# Patient Record
Sex: Female | Born: 1960
Health system: Southern US, Community
[De-identification: ages and names within clinical notes are randomized; demographics above are authoritative.]

## PROBLEM LIST (undated history)

## (undated) DIAGNOSIS — R011 Cardiac murmur, unspecified: Secondary | ICD-10-CM

## (undated) DIAGNOSIS — Z9889 Other specified postprocedural states: Secondary | ICD-10-CM

## (undated) DIAGNOSIS — D219 Benign neoplasm of connective and other soft tissue, unspecified: Secondary | ICD-10-CM

## (undated) DIAGNOSIS — I499 Cardiac arrhythmia, unspecified: Secondary | ICD-10-CM

## (undated) DIAGNOSIS — F988 Other specified behavioral and emotional disorders with onset usually occurring in childhood and adolescence: Secondary | ICD-10-CM

## (undated) DIAGNOSIS — R112 Nausea with vomiting, unspecified: Secondary | ICD-10-CM

## (undated) DIAGNOSIS — D649 Anemia, unspecified: Secondary | ICD-10-CM

## (undated) HISTORY — PX: ENDOMETRIAL ABLATION: SHX621

## (undated) HISTORY — PX: APPENDECTOMY: SHX54

## (undated) HISTORY — PX: DIAGNOSTIC LAPAROSCOPY: SUR761

## (undated) HISTORY — PX: COLON SURGERY: SHX602

---

## 2003-07-01 ENCOUNTER — Ambulatory Visit (HOSPITAL_COMMUNITY): Admission: RE | Admit: 2003-07-01 | Discharge: 2003-07-01 | Payer: Self-pay | Admitting: Obstetrics and Gynecology

## 2003-07-02 ENCOUNTER — Other Ambulatory Visit: Admission: RE | Admit: 2003-07-02 | Discharge: 2003-07-02 | Payer: Self-pay | Admitting: Obstetrics and Gynecology

## 2004-02-16 ENCOUNTER — Ambulatory Visit (HOSPITAL_COMMUNITY): Admission: RE | Admit: 2004-02-16 | Discharge: 2004-02-16 | Payer: Self-pay | Admitting: Obstetrics and Gynecology

## 2004-09-22 ENCOUNTER — Ambulatory Visit (HOSPITAL_COMMUNITY): Admission: RE | Admit: 2004-09-22 | Discharge: 2004-09-22 | Payer: Self-pay | Admitting: Obstetrics and Gynecology

## 2004-09-23 ENCOUNTER — Encounter (INDEPENDENT_AMBULATORY_CARE_PROVIDER_SITE_OTHER): Payer: Self-pay | Admitting: Specialist

## 2005-02-21 ENCOUNTER — Ambulatory Visit (HOSPITAL_COMMUNITY): Admission: RE | Admit: 2005-02-21 | Discharge: 2005-02-21 | Payer: Self-pay | Admitting: Obstetrics and Gynecology

## 2005-03-23 ENCOUNTER — Other Ambulatory Visit: Admission: RE | Admit: 2005-03-23 | Discharge: 2005-03-23 | Payer: Self-pay | Admitting: Obstetrics and Gynecology

## 2006-02-22 ENCOUNTER — Ambulatory Visit (HOSPITAL_COMMUNITY): Admission: RE | Admit: 2006-02-22 | Discharge: 2006-02-22 | Payer: Self-pay | Admitting: Obstetrics and Gynecology

## 2007-02-25 ENCOUNTER — Ambulatory Visit (HOSPITAL_COMMUNITY): Admission: RE | Admit: 2007-02-25 | Discharge: 2007-02-25 | Payer: Self-pay | Admitting: Obstetrics and Gynecology

## 2007-07-17 ENCOUNTER — Encounter: Admission: RE | Admit: 2007-07-17 | Discharge: 2007-07-17 | Payer: Self-pay | Admitting: Obstetrics and Gynecology

## 2007-07-22 ENCOUNTER — Encounter: Admission: RE | Admit: 2007-07-22 | Discharge: 2007-07-22 | Payer: Self-pay | Admitting: Interventional Radiology

## 2007-08-22 ENCOUNTER — Ambulatory Visit (HOSPITAL_COMMUNITY): Admission: RE | Admit: 2007-08-22 | Discharge: 2007-08-23 | Payer: Self-pay | Admitting: Interventional Radiology

## 2007-09-05 ENCOUNTER — Encounter: Admission: RE | Admit: 2007-09-05 | Discharge: 2007-09-05 | Payer: Self-pay | Admitting: Interventional Radiology

## 2008-03-16 ENCOUNTER — Encounter: Admission: RE | Admit: 2008-03-16 | Discharge: 2008-03-16 | Payer: Self-pay | Admitting: Obstetrics and Gynecology

## 2008-12-31 ENCOUNTER — Emergency Department (HOSPITAL_BASED_OUTPATIENT_CLINIC_OR_DEPARTMENT_OTHER): Admission: EM | Admit: 2008-12-31 | Discharge: 2008-12-31 | Payer: Self-pay | Admitting: Emergency Medicine

## 2009-03-24 ENCOUNTER — Ambulatory Visit (HOSPITAL_COMMUNITY): Admission: RE | Admit: 2009-03-24 | Discharge: 2009-03-24 | Payer: Self-pay | Admitting: Obstetrics and Gynecology

## 2009-08-04 ENCOUNTER — Ambulatory Visit: Admission: RE | Admit: 2009-08-04 | Discharge: 2009-08-04 | Payer: Self-pay | Admitting: Cardiology

## 2009-08-04 ENCOUNTER — Encounter (INDEPENDENT_AMBULATORY_CARE_PROVIDER_SITE_OTHER): Payer: Self-pay | Admitting: Cardiology

## 2009-08-09 ENCOUNTER — Ambulatory Visit (HOSPITAL_COMMUNITY): Admission: RE | Admit: 2009-08-09 | Discharge: 2009-08-09 | Payer: Self-pay | Admitting: Gastroenterology

## 2009-11-24 ENCOUNTER — Encounter: Admission: RE | Admit: 2009-11-24 | Discharge: 2010-01-03 | Payer: Self-pay | Admitting: Family Medicine

## 2010-04-05 ENCOUNTER — Ambulatory Visit (HOSPITAL_COMMUNITY)
Admission: RE | Admit: 2010-04-05 | Discharge: 2010-04-05 | Payer: Self-pay | Source: Home / Self Care | Attending: Obstetrics and Gynecology | Admitting: Obstetrics and Gynecology

## 2010-09-09 NOTE — Op Note (Signed)
NAMELYRIC, ROSSANO       ACCOUNT NO.:  [id number]   MEDICAL RECORD NO.:  [id number]          PATIENT TYPE:  AMB   LOCATION:  SDC                           FACILITY:  [facility]   PHYSICIAN:  Osborn Coho, M.D.   DATE OF BIRTH:  Mar 16, 1961   DATE OF PROCEDURE:  09/22/2004  DATE OF DISCHARGE:                                 OPERATIVE REPORT   PREOPERATIVE DIAGNOSES:  1.  Menorrhagia.  2.  Anemia.   POSTOPERATIVE DIAGNOSES:  1.  Menorrhagia.  2.  Anemia.   PROCEDURE:  Hysteroscopy, D&C and endometrial ablation via NovaSure.   SURGEON:  Osborn Coho, M.D.   ANESTHESIA:  MAC.   FLUIDS:  1000 mL.   URINE OUTPUT:  Straight cath prior to procedure with sufficient quantity.   ESTIMATED BLOOD LOSS:  Minimal.   HYSTEROSCOPIC FLUID:  Deficit of LR 60 mL.   PATHOLOGY:  Endometrial curettings.   COMPLICATIONS:  None.   FINDINGS:  The uterus sounded to 11.5 cm, cervical length 4.5 cm, cavity  length 7 cm set at 6.5 plus on the NovaSure. Cavity width 4.6 cm, power 164,  watts, time 1 minute and 2 seconds.   DESCRIPTION OF PROCEDURE:  The patient was taken to the operating room and  after the risks, benefits, and alternatives were reviewed with the patient,  the patient verbalized an understanding and consent signed and witnessed.  The patient was placed under MAC per anesthesia and prepped and draped in  the normal sterile fashion. A bivalve speculum was placed in the patient's  vagina and a total of 10 mL of 1% lidocaine was administered for a  paracervical block. The anterior lip of the cervix was grasped with a single  tooth tenaculum and the cervix dilated for passage of the diagnostic  hysteroscope. Cervical length and uterus sounded to measurements as noted  above. The diagnostic hysteroscope was introduced and no intracavitary  lesions noted. The diagnostic hysteroscope was removed and curettage  performed and specimen sent to pathology. NovaSure was then set with  appropriate settings  and introduced into the endometrial cavity. Ablation was then performed with  a time of 1 minute and 2 seconds. All instruments were removed. There was  hemostasis at the tenaculum site. The patient tolerated the procedure well  and is awaiting transfer to the recovery room. Sponge count was correct.       AR/MEDQ  D:  09/22/2004  T:  09/22/2004  Job:  [id number]

## 2011-01-17 LAB — CBC
HCT: 32.5 — ABNORMAL LOW
Hemoglobin: 10.4 — ABNORMAL LOW
MCHC: 32.1
MCV: 71.3 — ABNORMAL LOW
Platelets: 452 — ABNORMAL HIGH
RBC: 4.56
RDW: 24.4 — ABNORMAL HIGH
WBC: 3.4 — ABNORMAL LOW

## 2011-01-17 LAB — CREATININE, SERUM
Creatinine, Ser: 0.85
GFR calc Af Amer: >60
GFR calc non Af Amer: >60

## 2011-01-17 LAB — HCG, SERUM, QUALITATIVE: Preg, Serum: NEGATIVE

## 2011-02-02 ENCOUNTER — Other Ambulatory Visit (HOSPITAL_COMMUNITY): Payer: Self-pay | Admitting: Obstetrics and Gynecology

## 2011-02-02 DIAGNOSIS — Z1231 Encounter for screening mammogram for malignant neoplasm of breast: Secondary | ICD-10-CM

## 2011-04-07 ENCOUNTER — Ambulatory Visit (HOSPITAL_COMMUNITY)
Admission: RE | Admit: 2011-04-07 | Discharge: 2011-04-07 | Disposition: A | Discharge status: Home / Self Care | Payer: 59 | Source: Ambulatory Visit | Attending: Obstetrics and Gynecology | Admitting: Obstetrics and Gynecology

## 2011-04-07 DIAGNOSIS — Z1231 Encounter for screening mammogram for malignant neoplasm of breast: Secondary | ICD-10-CM | POA: Insufficient documentation

## 2011-11-15 ENCOUNTER — Other Ambulatory Visit: Payer: Self-pay | Admitting: Obstetrics and Gynecology

## 2011-11-15 MED ORDER — CIPROFLOXACIN HCL 250 MG PO TABS: 250.0000 mg | ORAL_TABLET | Freq: Two times a day (BID) | ORAL | Status: DC

## 2011-11-15 MED ORDER — CIPROFLOXACIN HCL 250 MG PO TABS: 250.0000 mg | ORAL_TABLET | Freq: Two times a day (BID) | ORAL | Status: AC

## 2011-11-28 ENCOUNTER — Other Ambulatory Visit: Payer: Self-pay | Admitting: Obstetrics and Gynecology

## 2011-11-28 MED ORDER — FLUCONAZOLE 150 MG PO TABS: 150.0000 mg | ORAL_TABLET | Freq: Once | ORAL | Status: AC

## 2011-11-28 NOTE — Progress Notes (Signed)
Pt requesting Rx for diflucan and is out of town.  Rec be seen if sxs persist.

## 2012-01-12 ENCOUNTER — Other Ambulatory Visit: Payer: Self-pay | Admitting: Obstetrics and Gynecology

## 2012-01-12 MED ORDER — FLUCONAZOLE 150 MG PO TABS: 150.0000 mg | ORAL_TABLET | Freq: Once | ORAL | Status: DC

## 2012-02-13 ENCOUNTER — Other Ambulatory Visit: Payer: Self-pay | Admitting: Obstetrics and Gynecology

## 2012-02-13 MED ORDER — TRANEXAMIC ACID 650 MG PO TABS: 1300.0000 mg | ORAL_TABLET | Freq: Three times a day (TID) | ORAL | Status: DC

## 2012-02-13 MED ORDER — FLUCONAZOLE 150 MG PO TABS: 150.0000 mg | ORAL_TABLET | Freq: Once | ORAL | Status: DC

## 2012-04-15 ENCOUNTER — Other Ambulatory Visit: Payer: Self-pay | Admitting: Obstetrics and Gynecology

## 2012-04-15 MED ORDER — LORATADINE 10 MG PO TABS: 10.0000 mg | ORAL_TABLET | Freq: Every day | ORAL | Status: DC

## 2012-07-16 ENCOUNTER — Encounter (HOSPITAL_BASED_OUTPATIENT_CLINIC_OR_DEPARTMENT_OTHER): Payer: Self-pay

## 2012-07-16 ENCOUNTER — Emergency Department (HOSPITAL_BASED_OUTPATIENT_CLINIC_OR_DEPARTMENT_OTHER): Payer: BC Managed Care – HMO

## 2012-07-16 ENCOUNTER — Emergency Department (HOSPITAL_BASED_OUTPATIENT_CLINIC_OR_DEPARTMENT_OTHER)
Admission: EM | Admit: 2012-07-16 | Discharge: 2012-07-16 | Disposition: A | Discharge status: Home / Self Care | Payer: BC Managed Care – HMO | Attending: Emergency Medicine | Admitting: Emergency Medicine

## 2012-07-16 DIAGNOSIS — M545 Low back pain, unspecified: Secondary | ICD-10-CM | POA: Insufficient documentation

## 2012-07-16 DIAGNOSIS — Z3202 Encounter for pregnancy test, result negative: Secondary | ICD-10-CM | POA: Insufficient documentation

## 2012-07-16 DIAGNOSIS — Z8742 Personal history of other diseases of the female genital tract: Secondary | ICD-10-CM | POA: Insufficient documentation

## 2012-07-16 DIAGNOSIS — N390 Urinary tract infection, site not specified: Secondary | ICD-10-CM

## 2012-07-16 DIAGNOSIS — M549 Dorsalgia, unspecified: Secondary | ICD-10-CM

## 2012-07-16 DIAGNOSIS — R109 Unspecified abdominal pain: Secondary | ICD-10-CM | POA: Insufficient documentation

## 2012-07-16 DIAGNOSIS — N949 Unspecified condition associated with female genital organs and menstrual cycle: Secondary | ICD-10-CM | POA: Insufficient documentation

## 2012-07-16 DIAGNOSIS — R3915 Urgency of urination: Secondary | ICD-10-CM | POA: Insufficient documentation

## 2012-07-16 DIAGNOSIS — R300 Dysuria: Secondary | ICD-10-CM | POA: Insufficient documentation

## 2012-07-16 HISTORY — DX: Benign neoplasm of connective and other soft tissue, unspecified: D21.9

## 2012-07-16 LAB — URINALYSIS, ROUTINE W REFLEX MICROSCOPIC
Glucose, UA: NEGATIVE mg/dL
Hgb urine dipstick: NEGATIVE
Ketones, ur: 15 mg/dL — AB
Nitrite: NEGATIVE
Protein, ur: NEGATIVE mg/dL
Specific Gravity, Urine: 1.03 (ref 1.005–1.030)
Urobilinogen, UA: 1 mg/dL (ref 0.0–1.0)
pH: 6.5 (ref 5.0–8.0)

## 2012-07-16 LAB — CBC WITH DIFFERENTIAL/PLATELET
Eosinophils Absolute: 0.5 10*3/uL (ref 0.0–0.7)
Lymphocytes Relative: 22 % (ref 12–46)
Lymphs Abs: 1.3 10*3/uL (ref 0.7–4.0)
Neutrophils Relative %: 61 % (ref 43–77)
Platelets: 282 10*3/uL (ref 150–400)
RBC: 4.26 MIL/uL (ref 3.87–5.11)
WBC: 6 10*3/uL (ref 4.0–10.5)

## 2012-07-16 LAB — BASIC METABOLIC PANEL
GFR calc non Af Amer: 85 mL/min — ABNORMAL LOW (ref >90)
Glucose, Bld: 73 mg/dL (ref 70–99)
Potassium: 3.8 mEq/L (ref 3.5–5.1)
Sodium: 139 mEq/L (ref 135–145)

## 2012-07-16 LAB — URINE MICROSCOPIC-ADD ON

## 2012-07-16 LAB — PREGNANCY, URINE: Preg Test, Ur: NEGATIVE

## 2012-07-16 MED ORDER — DICLOFENAC SODIUM 75 MG PO TBEC: 75.0000 mg | DELAYED_RELEASE_TABLET | Freq: Two times a day (BID) | ORAL | Status: DC

## 2012-07-16 MED ORDER — NITROFURANTOIN MONOHYD MACRO 100 MG PO CAPS: 100.0000 mg | ORAL_CAPSULE | Freq: Two times a day (BID) | ORAL | Status: DC

## 2012-07-16 NOTE — ED Notes (Signed)
C/o lower back pain, pelvic pain, difficulty urinating-s/s started 5 days ago-+nausea-+constiaption-used enema then episode of diarrhea with results

## 2012-07-16 NOTE — ED Provider Notes (Signed)
History     CSN: [id number]  Arrival date & time 07/16/12  1137   First MD Initiated Contact with Patient 07/16/12 1228      Chief Complaint  Patient presents with  . Back Pain    (Consider location/radiation/quality/duration/timing/severity/associated sxs/prior treatment) HPI Comments: Patient is a 52 y/o F with PMHx of fibroids c/o lower back pain x 5 days. Patient reported that pain is localized to lower back described as a tightness with association to abdominal pain described as a fullness to the suprapubic region. Patient reported that back pain radiates to legs b/l  and suprapubic region. Pain is described as being severe. Patient reported using Ibuprofen, but found little relief. Patient reported dysuria and increase in urgency x 3 days - has gotten progressive worse, extreme burning sensation upon urination with presence of blood. Denied fever, vomiting, vaginal discharge, vaginal pruritis, shortness of breathe, chest pain, difficulty breathing.  Patient reported being sexually active, reported using protection.   Patient is a 52 y.o. female presenting with back pain. The history is provided by the patient. No language interpreter was used.  Back Pain Location:  Lumbar spine Quality: tightness. Pain severity:  Severe Pain is:  Same all the time Onset quality:  Sudden Duration:  5 days Timing:  Constant Progression:  Unchanged Chronicity:  New Context: not recent injury   Relieved by:  Nothing Worsened by:  Movement Ineffective treatments:  NSAIDs (used Ibuprofen) Associated symptoms: dysuria and pelvic pain   Associated symptoms: no abdominal pain, no abdominal swelling, no chest pain, no fever, no headaches and no leg pain   Dysuria:    Severity:  Moderate   Onset quality:  Sudden   Duration:  3 days   Timing:  Intermittent   Progression:  Worsening   Chronicity:  New Risk factors: not pregnant     Past Medical History  Diagnosis Date  . Fibroids     Past  Surgical History  Procedure Laterality Date  . Cesarean section    . Uterine artery embolization      No family history on file.  History  Substance Use Topics  . Smoking status: Never Smoker   . Smokeless tobacco: Not on file  . Alcohol Use: Yes    OB History   Grav Para Term Preterm Abortions TAB SAB Ect Mult Living                  Review of Systems  Constitutional: Negative for fever and chills.  HENT: Negative for neck pain.   Respiratory: Negative for chest tightness and shortness of breath.   Cardiovascular: Negative for chest pain.  Gastrointestinal: Negative for abdominal pain.  Genitourinary: Positive for dysuria, urgency and pelvic pain.  Musculoskeletal: Positive for back pain.  Neurological: Negative for headaches.  10 Systems reviewed and are negative for acute change except as noted in the HPI.   Allergies  Review of patient's allergies indicates no known allergies.  Home Medications   Current Outpatient Rx  Name  Route  Sig  Dispense  Refill  . diclofenac (VOLTAREN) 75 MG EC tablet   Oral   Take 1 tablet (75 mg total) by mouth 2 (two) times daily.   14 tablet   0   . fluconazole (DIFLUCAN) 150 MG tablet   Oral   Take 1 tablet (150 mg total) by mouth once.   1 tablet   2   . loratadine (CLARITIN) 10 MG tablet   Oral   Take  1 tablet (10 mg total) by mouth daily.   30 tablet   1   . nitrofurantoin, macrocrystal-monohydrate, (MACROBID) 100 MG capsule   Oral   Take 1 capsule (100 mg total) by mouth 2 (two) times daily. X 7 days   14 capsule   0   . tranexamic acid (LYSTEDA) 650 MG TABS   Oral   Take 2 tablets (1,300 mg total) by mouth 3 (three) times daily.   30 tablet   0     BP 144/65  Pulse 83  Temp(Src) 98.3 F (36.8 C) (Oral)  Resp 16  Ht 5\' 8"  (1.727 m)  Wt 150 lb (68.04 kg)  BMI 22.81 kg/m2  SpO2 100%  LMP 07/13/2012  Physical Exam  Nursing note and vitals reviewed. Constitutional: She is oriented to person, place,  and time. She appears well-developed and well-nourished. No distress.  Eyes: Conjunctivae and EOM are normal. Pupils are equal, round, and reactive to light. Right eye exhibits no discharge. Left eye exhibits no discharge.  Neck: Normal range of motion. Neck supple. No tracheal deviation present.  Cardiovascular: Normal rate, regular rhythm, normal heart sounds and intact distal pulses.  Exam reveals no friction rub.   No murmur heard. Pulmonary/Chest: Effort normal and breath sounds normal. She has no wheezes. She has no rales.  Abdominal: Soft. Bowel sounds are normal. She exhibits no distension. There is tenderness. There is no rebound.  Mild tenderness to suprapubic region.   Negative CVA tenderness b/l.  Lymphadenopathy:    She has no cervical adenopathy.  Neurological: She is alert and oriented to person, place, and time.  Skin: Skin is warm and dry. No rash noted. She is not diaphoretic. No erythema.  Psychiatric: She has a normal mood and affect. Her behavior is normal.    ED Course  Procedures (including critical care time)  Labs Reviewed  URINALYSIS, ROUTINE W REFLEX MICROSCOPIC - Abnormal; Notable for the following:    Bilirubin Urine SMALL (*)    Ketones, ur 15 (*)    Leukocytes, UA SMALL (*)    All other components within normal limits  URINE MICROSCOPIC-ADD ON - Abnormal; Notable for the following:    Squamous Epithelial / LPF FEW (*)    Bacteria, UA FEW (*)    All other components within normal limits  CBC WITH DIFFERENTIAL - Abnormal; Notable for the following:    Hemoglobin 11.6 (*)    HCT 35.6 (*)    RDW 16.3 (*)    Eosinophils Relative 8 (*)    All other components within normal limits  BASIC METABOLIC PANEL - Abnormal; Notable for the following:    GFR calc non Af Amer 85 (*)    All other components within normal limits  URINE CULTURE  PREGNANCY, URINE   Ct Abdomen Pelvis Wo Contrast  07/16/2012  *RADIOLOGY REPORT*  Clinical Data: Back pain, hematuria.   CT ABDOMEN AND PELVIS WITHOUT CONTRAST  Technique:  Multidetector CT imaging of the abdomen and pelvis was performed following the standard protocol without intravenous contrast.  Comparison: None.  Findings: Visualized lung bases appear normal.  These unenhanced images demonstrate no focal abnormality involving the liver, spleen or pancreas.  No gallstones are noted.  Adrenal glands appear normal.  No hydronephrosis or renal obstruction is noted.  2 cm cyst is seen in mid pole of right kidney.  No renal or ureteral calculi are noted.  The appendix appears normal.  4.6 cm low density is noted in right  adnexa most consistent with renal cyst. Enlarged uterus is noted with multiple calcified degenerating fibroids.  Urinary bladder appears normal.  Stool is noted in the rectum.  IMPRESSION: No hydronephrosis or renal obstruction is identified.  4.6 cm probable right ovarian cyst is noted.  Fibroid uterus is noted.   Original Report Authenticated By: Lupita Raider.,  M.D.      1. UTI (urinary tract infection)   2. Abdominal pain   3. Back pain       MDM  Patient is a 52 y/o F with PMHx of uterine fibroids c/o lower back pain associated with suprapubic pressure x 5 days with the occurrence of dysuria and hematuria x 3 days. Patient denied fever, chills, vaginal discharge, calf pain, shortness of breathe, chest pain, difficulty breathing.  I personally evaluated and examined the patient. PE: Alert and oriented. Abdomen: Non-distended, soft, pain upon palpation to suprapubic region, no guarding or rebound tenderness. No CVA tenderness. Lungs: CTA b/l. Heart: regular rate and rhythm, S1/S2 heard normal.  Ordered CBC, BMP, UA, UA microscopic, Urine culture, Pregnancy test, CT Abdomen and Pelivs without contrast  CBC: low Hgb and Hct - trend seen from previous labs that have been reviewed BMP: negative findings UA: small bilrubin, small leukocytes - negative nitrites, proteins, glucose Pregnancy urine:  negative Urine microscopic: few bacteria, mucus present CT Abdomen Pelvis wo contrast: no hydronephrosis or renal obstruction. 4.6 cm right ovarian cyst, possible renal cyst. Fibroid of uterus noted. Urine culture pending  Patient afebrile, normotensive, non-tachycardic, alert and oriented, happy affect. Patient aseptic, non-toxic appearing. Discharged patient with possible beginnings of UTI. Discharged patient with Macrobid and Voltaren. Discussed with patient to follow-up with Dr. Osborn Coho regarding condition and recent visit to the ED, discussed with patient to discuss with gynecologist regarding right ovarian cyst found on CT Abdomen Pelvis. Discussed with patient that she can continue drinking cranberry juice. Discussed with patient to return to the ED if symptoms are to worsen. Patient agreed with plan of care, understood, all questions answered.         Raymon Mutton, PA-C 07/16/12 2243

## 2012-07-16 NOTE — ED Notes (Signed)
Patient transported to CT 

## 2012-07-17 LAB — URINE CULTURE

## 2012-07-17 NOTE — ED Provider Notes (Signed)
Medical screening examination/treatment/procedure(s) were performed by non-physician practitioner and as supervising physician I was immediately available for consultation/collaboration.   Monike Bragdon III, MD 07/17/12 2115 

## 2012-07-17 NOTE — ED Provider Notes (Signed)
Urine cultures reviewed. Positive findings for UTI, >/= 100,000 colonies, growth of E. Coli. Patient was discharged with Macrobid for infection.   Raymon Mutton, PA-C 07/17/12 1713

## 2012-07-18 ENCOUNTER — Telehealth (HOSPITAL_COMMUNITY): Payer: Self-pay | Admitting: Emergency Medicine

## 2012-07-18 NOTE — ED Provider Notes (Signed)
Medical screening examination/treatment/procedure(s) were performed by non-physician practitioner and as supervising physician I was immediately available for consultation/collaboration.   Carleene Cooper III, MD 07/18/12 [phone]

## 2012-07-18 NOTE — ED Notes (Signed)
Positive urnc- treated per protocol. No further follow up at this time.  

## 2012-08-16 ENCOUNTER — Other Ambulatory Visit: Payer: Self-pay | Admitting: Obstetrics and Gynecology

## 2012-08-16 DIAGNOSIS — Z1231 Encounter for screening mammogram for malignant neoplasm of breast: Secondary | ICD-10-CM

## 2012-08-29 ENCOUNTER — Ambulatory Visit (HOSPITAL_COMMUNITY): Payer: BC Managed Care – HMO

## 2012-08-29 ENCOUNTER — Ambulatory Visit (HOSPITAL_COMMUNITY)
Admission: RE | Admit: 2012-08-29 | Discharge: 2012-08-29 | Disposition: A | Discharge status: Home / Self Care | Payer: 59 | Source: Ambulatory Visit | Attending: Obstetrics and Gynecology | Admitting: Obstetrics and Gynecology

## 2012-08-29 DIAGNOSIS — Z1231 Encounter for screening mammogram for malignant neoplasm of breast: Secondary | ICD-10-CM | POA: Insufficient documentation

## 2012-10-21 ENCOUNTER — Telehealth (INDEPENDENT_AMBULATORY_CARE_PROVIDER_SITE_OTHER): Payer: Self-pay | Admitting: General Surgery

## 2012-10-21 NOTE — Telephone Encounter (Signed)
Nurse called from pt's GI office. Stated they have a water leak at this time and are unable to fax the Preliminary Results from the Colonoscopy. Pt has  NP appt with Myrtis Hopping 7/1. She advised it would be sent over before tomm.

## 2012-10-22 ENCOUNTER — Ambulatory Visit (INDEPENDENT_AMBULATORY_CARE_PROVIDER_SITE_OTHER): Payer: Commercial Managed Care - PPO | Admitting: General Surgery

## 2012-10-22 ENCOUNTER — Encounter (INDEPENDENT_AMBULATORY_CARE_PROVIDER_SITE_OTHER): Payer: Self-pay | Admitting: General Surgery

## 2012-10-22 VITALS — BP 124/70 | HR 80 | Resp 16 | Ht 68.0 in | Wt 159.2 lb

## 2012-10-22 DIAGNOSIS — K6389 Other specified diseases of intestine: Secondary | ICD-10-CM

## 2012-10-22 NOTE — Progress Notes (Signed)
Patient ID: Jocelyn Howell, female   DOB: 1960/07/27, 52 y.o.   MRN: [id number]  Chief Complaint  Patient presents with  . New Evaluation    eval near obs colon mass    HPI Jocelyn Howell is a 52 y.o. female.  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  She underwent first screening colonoscopy at age 45 with Dr Bosie Clos. She was found to have a tortuous colon and a partially obstructing mass in the distal ascending colon that was tattoed. She had another small polyp in the transverse colon which was completely excised. 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  This is not resectable endoscopically and she was referred. She has some intermittent right sided abdominal cramping and increased bs but otherwise has no change in either stool caliber or bowel habits.  No n/v/weight loss.  Past Medical History  Diagnosis Date  . Fibroids     Past Surgical History  Procedure Laterality Date  . Cesarean section    . Endometrial ablation    . Laparoscopy      History reviewed. No pertinent family history.  Social History History  Substance Use Topics  . Smoking status: Never Smoker   . Smokeless tobacco: Not on file  . Alcohol Use: Yes    No Known Allergies  Current Outpatient Prescriptions  Medication Sig Dispense Refill  . amphetamine-dextroamphetamine (ADDERALL XR) 10 MG 24 hr capsule Take 10 mg by mouth every morning.      . diclofenac (VOLTAREN) 75 MG EC tablet Take 1 tablet (75 mg total) by mouth 2 (two) times daily.  14 tablet  0  . fluconazole (DIFLUCAN) 150 MG tablet Take 1 tablet (150 mg total) by mouth once.  1 tablet  2  . loratadine (CLARITIN) 10 MG tablet Take 1 tablet (10 mg total) by mouth daily.  30 tablet  1  . nitrofurantoin, macrocrystal-monohydrate, (MACROBID) 100 MG capsule Take 1 capsule (100 mg total) by mouth 2 (two) times daily. X 7 days  14  capsule  0  . tranexamic acid (LYSTEDA) 650 MG TABS Take 2 tablets (1,300 mg total) by mouth 3 (three) times daily.  30 tablet  0   No current facility-administered medications for this visit.    Review of Systems Review of Systems  Constitutional: Negative for fever, chills and unexpected weight change.  HENT: Negative for hearing loss, congestion, sore throat, trouble swallowing and voice change.   Eyes: Negative for visual disturbance.  Respiratory: Negative for cough and wheezing.   Cardiovascular: Negative for chest pain, palpitations and leg swelling.  Gastrointestinal: Negative for nausea, vomiting, abdominal pain, diarrhea, constipation, blood in stool, abdominal distention and anal bleeding.  Genitourinary: Negative for hematuria, vaginal bleeding and difficulty urinating.  Musculoskeletal: Negative for arthralgias.  Skin: Negative for rash and wound.  Neurological: Negative for seizures, syncope and headaches.  Hematological: Negative for adenopathy. Does not bruise/bleed easily.  Psychiatric/Behavioral: Negative for confusion.    Blood pressure 124/70, pulse 80, resp. rate 16, height 5\' 8"  (1.727 m), weight 159 lb 3.2 oz (72.213 kg).  Physical Exam Physical Exam  Vitals reviewed. Constitutional: She is oriented to person, place, and time. She appears well-developed and well-nourished.  Eyes: No scleral icterus.  Neck: Neck supple.  Cardiovascular: Normal rate, regular rhythm and normal heart sounds.   Pulmonary/Chest: Effort normal and breath sounds normal.  She has no wheezes. She has no rales.  Abdominal: Soft. Bowel sounds are normal. She exhibits no mass. There is no tenderness.  Lymphadenopathy:    She has no cervical adenopathy.  Neurological: She is oriented to person, place, and time.    Data Reviewed Colonoscopy report from Dr Bosie Clos  Assessment    Right colon mass     Plan    Laparoscopic right colectomy     Cancer of the Colon, Surgical  Treatment  Based on the location and type of colon cancer that you have, we have determined that surgical removal of this part of your colon will be part of your treatment.  This will treat symptoms of bleeding or blockage that you may be experiencing.  I will do everything that I can to remove your entire tumor safely. To do this, some normal tissue must also be removed to give you the best chance for a cure. The following will help describe what happens when you have this surgery. We discussed although there is not cancer in current path that is possibility given size. Hopefully this will be benign though.  TREATMENT Surgery is the most common treatment for colorectal cancer.It treats the cancer in the colon and the area close to the tumor by removing the tumor and some of the healthy tissue around it. This tissue includes an apron of tissue with lymph nodes and blood vessels.  We usually use laparoscopy (smaller incisions) to help minimize the size of the larger incision on your abdomen.  However, even if we use laparoscopy primarily, we still need to make an incision large enough to bring the two ends of the colon out to reconnect them.  For tumors that are very large or very adherent to the adjacent structures, we may have to make a larger incision to do your operation safely. The surgeon checks the rest of the abdomen, the intestine and the liver to see if the cancer has spread.  There may be microscopic disease present that we are unable to detect.    The part of colon that we plan to remove in your case is the right side.  WHAT HAPPENS AFTER SURGERY: After surgery, you will go first to the recovery room, then to your room.   You will have  tubes and lines in place which is standard for this operation.  You will have several IVs..  YOU MIGHT NOT BE ABLE TO EAT FOR SEVERAL DAYS AFTER SURGERY.  You will have a catheter in your bladder for around 1 days.  You will have compression stockings on your  legs to decrease the risk of blood clots.    We will address your pain in several ways.  We will use an IV pain pump called a "PCA," or Patient Controlled Analgesia.  This allows you to press a button and immediately receive a dose of pain medication without waiting for a nurse.  We also use IV Tylenol and sometimes IV Toradol which is similar to ibuprofen.   I use doses and medications that work for the majority of people, but you may need an adjustment to the dose or type of medicine if your pain is not adequately controlled.  Your throat may be sore, in which case you may need a throat spray or lozenges.  Some patients become disoriented with the pain medication and may need adjustments due to that.    We will ask you to get out of bed the day after surgery  in order to maximize your chances of not having complications.  Your risk of pneumonia and blood clots is lower with walking and sitting in the chair.  We will also ask you to perform breathing exercises.  We will also ask to you walk in your room and in the halls for the above reasons, but also in order for you to keep up your strength.     POSSIBLE COMPLICATIONS This is a major operation and includes complications listed below: Bleeding Infection (5-20%) Possible wound complications such as hernia Damage to adjacent structures Leak of surgical connections, which can lead to other surgeries and possibly an ostomy. (1-5%) Possible need for other procedures, such as abscess drains in radiology  Possible prolonged hospital stay Possible diarrhea from removal of part of the colon. Possible constipation from narcotics.    Prolonged fatigue/weakness/appetite MOST PATIENTS' ENERGY LEVEL IS NOT BACK TO NORMAL FOR AT LEAST 2-4 MONTHS.  OLDER PATIENTS MAY FEEL WEAK FOR LONGER PERIODS OF TIME.   Difficulty with eating or post operative nausea  Possible early recurrence of cancer Possible complications of your medical problems such as heart disease  or arrhythmias. Death (less than 1%)  All possible complications are not listed, just the most common.    FURTHER INFORMATION? Please ask questions if you find something that we did not discuss in the office and would like more information.  If you would like another appointment if you have many questions or if your family members would like to come as well, please contact the office.        Jocelyn Howell 10/22/2012, 3:43 PM

## 2012-10-28 ENCOUNTER — Encounter (HOSPITAL_COMMUNITY): Payer: Self-pay | Admitting: Respiratory Therapy

## 2012-10-29 ENCOUNTER — Encounter (INDEPENDENT_AMBULATORY_CARE_PROVIDER_SITE_OTHER): Payer: Self-pay

## 2012-10-29 ENCOUNTER — Encounter (HOSPITAL_COMMUNITY): Payer: Self-pay

## 2012-10-29 ENCOUNTER — Encounter (HOSPITAL_COMMUNITY)
Admission: RE | Admit: 2012-10-29 | Discharge: 2012-10-29 | Disposition: A | Discharge status: Home / Self Care | Payer: 59 | Source: Ambulatory Visit | Attending: General Surgery | Admitting: General Surgery

## 2012-10-29 ENCOUNTER — Telehealth (INDEPENDENT_AMBULATORY_CARE_PROVIDER_SITE_OTHER): Payer: Self-pay | Admitting: General Surgery

## 2012-10-29 HISTORY — DX: Other specified behavioral and emotional disorders with onset usually occurring in childhood and adolescence: F98.8

## 2012-10-29 HISTORY — DX: Cardiac arrhythmia, unspecified: I49.9

## 2012-10-29 HISTORY — DX: Other specified postprocedural states: Z98.890

## 2012-10-29 HISTORY — DX: Anemia, unspecified: D64.9

## 2012-10-29 HISTORY — DX: Cardiac murmur, unspecified: R01.1

## 2012-10-29 HISTORY — DX: Other specified postprocedural states: R11.2

## 2012-10-29 LAB — CBC WITH DIFFERENTIAL/PLATELET
Basophils Relative: 0 % (ref 0–1)
Eosinophils Relative: 8 % — ABNORMAL HIGH (ref 0–5)
Hemoglobin: 12 g/dL (ref 12.0–15.0)
Lymphocytes Relative: 49 % — ABNORMAL HIGH (ref 12–46)
MCH: 27.1 pg (ref 26.0–34.0)
Monocytes Absolute: 0.4 10*3/uL (ref 0.1–1.0)
Monocytes Relative: 5 % (ref 3–12)
Neutrophils Relative %: 38 % — ABNORMAL LOW (ref 43–77)
RBC: 4.42 MIL/uL (ref 3.87–5.11)

## 2012-10-29 LAB — COMPREHENSIVE METABOLIC PANEL
ALT: 9 U/L (ref 0–35)
Alkaline Phosphatase: 54 U/L (ref 39–117)
CO2: 28 mEq/L (ref 19–32)
Chloride: 105 mEq/L (ref 96–112)
GFR calc Af Amer: >90 mL/min (ref >90)
GFR calc non Af Amer: 83 mL/min — ABNORMAL LOW (ref >90)
Glucose, Bld: 86 mg/dL (ref 70–99)
Potassium: 4 mEq/L (ref 3.5–5.1)
Sodium: 140 mEq/L (ref 135–145)
Total Bilirubin: 0.5 mg/dL (ref 0.3–1.2)

## 2012-10-29 MED ORDER — CEFAZOLIN SODIUM-DEXTROSE 2-3 GM-% IV SOLR
2.0000 g | INTRAVENOUS | Status: AC
  Administered 2012-10-30: 2 g via INTRAVENOUS
  Filled 2012-10-29: qty 50

## 2012-10-29 MED ORDER — PEG 3350-KCL-NABCB-NACL-NASULF 236 G PO SOLR: 4.0000 L | Freq: Once | ORAL | Status: DC

## 2012-10-29 MED ORDER — METRONIDAZOLE IN NACL 5-0.79 MG/ML-% IV SOLN
500.0000 mg | INTRAVENOUS | Status: AC
  Administered 2012-10-30: 500 mg via INTRAVENOUS
  Filled 2012-10-29: qty 100

## 2012-10-29 NOTE — Pre-Procedure Instructions (Signed)
Jocelyn Howell  10/29/2012   Your procedure is scheduled on:  Wednesday, July 9th.  Report to [facility] at 8:10AM.  Call this number if you have problems the morning of surgery: [phone]   Remember:   Do not eat food or drink liquids after midnight.   Take these medicines the morning of surgery with A SIP OF WATER: None   Do not wear jewelry, make-up or nail polish.  Do not wear lotions, powders, or perfumes.  Do not shave 48 hours prior to surgery.   Do not bring valuables to the hospital.  [facility] is not responsible for any belongings or valuables.  Contacts, dentures or bridgework may not be worn into surgery.  Leave suitcase in the car. After surgery it may be brought to your room.  For patients admitted to the hospital, checkout time is 11:00 AM the day of discharge.   Patients discharged the day of surgery will not be allowed to drive home.  Name and phone number of your driver: -  Special Instructions: Shower with CHG wash (Bactoshield) tonight and again in the am prior to arriving to hospital.   Please read over the following fact sheets that you were given: Pain Booklet, Coughing and Deep Breathing, Blood Transfusion Information and Surgical Site Infection Prevention

## 2012-10-29 NOTE — Telephone Encounter (Signed)
Called pt again to give her instructions about the Miralax prep. The pt was planning on working today until 8pm and wanted to know if she could start drinking the prep at that time so I asked Dr Dwain Sarna about the start time for the prep. Dr Dwain Sarna advised ok for pt to start drinking Miralax at 8pm and she only has to drink half of the 64oz. Prep continue to stay on the clear liquids. The pt requested I phone the Miralax rx to the out pt. Pharmacy at [facility] so I will call the rx there for her to p/u. The pt understands.

## 2012-10-29 NOTE — Telephone Encounter (Signed)
Preadmissions called to see if patient needs a bowel prep for procedure tomorrow. I paged Dr Dwain Sarna who does want her to have the golytely prep. Golytely RX sent to patient's pharmacy. LMOM for patient to call back and ask for me. To make her aware that RX called in and let her know she needs to pick up instructions for prep. Awaiting call back.

## 2012-10-30 ENCOUNTER — Inpatient Hospital Stay (HOSPITAL_COMMUNITY): Payer: 59 | Admitting: Anesthesiology

## 2012-10-30 ENCOUNTER — Encounter (HOSPITAL_COMMUNITY): Payer: Self-pay | Admitting: Anesthesiology

## 2012-10-30 ENCOUNTER — Encounter (HOSPITAL_COMMUNITY): Payer: Self-pay | Admitting: *Deleted

## 2012-10-30 ENCOUNTER — Encounter (HOSPITAL_COMMUNITY)
Admission: RE | Disposition: A | Discharge status: Home / Self Care | Payer: Self-pay | Source: Ambulatory Visit | Attending: General Surgery

## 2012-10-30 ENCOUNTER — Inpatient Hospital Stay (HOSPITAL_COMMUNITY)
Admission: RE | Admit: 2012-10-30 | Discharge: 2012-11-04 | DRG: 330 | Disposition: A | Discharge status: Home / Self Care | Payer: 59 | Source: Ambulatory Visit | Attending: General Surgery | Admitting: General Surgery

## 2012-10-30 DIAGNOSIS — D126 Benign neoplasm of colon, unspecified: Principal | ICD-10-CM | POA: Diagnosis present

## 2012-10-30 DIAGNOSIS — K560 Paralytic ileus: Secondary | ICD-10-CM | POA: Diagnosis present

## 2012-10-30 DIAGNOSIS — K429 Umbilical hernia without obstruction or gangrene: Secondary | ICD-10-CM | POA: Diagnosis present

## 2012-10-30 HISTORY — PX: HERNIA REPAIR: SHX51

## 2012-10-30 HISTORY — PX: LAPAROSCOPIC PARTIAL COLECTOMY: SHX5907

## 2012-10-30 LAB — CBC
HCT: 34.9 % — ABNORMAL LOW (ref 36.0–46.0)
Hemoglobin: 11.4 g/dL — ABNORMAL LOW (ref 12.0–15.0)
MCHC: 32.7 g/dL (ref 30.0–36.0)
RBC: 4.29 MIL/uL (ref 3.87–5.11)

## 2012-10-30 LAB — CREATININE, SERUM
GFR calc Af Amer: >90 mL/min (ref >90)
GFR calc non Af Amer: >90 mL/min (ref >90)

## 2012-10-30 SURGERY — LAPAROSCOPIC PARTIAL COLECTOMY
Anesthesia: General | Site: Abdomen | Laterality: Right | Wound class: Contaminated

## 2012-10-30 MED ORDER — METOCLOPRAMIDE HCL 5 MG/ML IJ SOLN
INTRAMUSCULAR | Status: AC
  Filled 2012-10-30: qty 2

## 2012-10-30 MED ORDER — DIPHENHYDRAMINE HCL 12.5 MG/5ML PO ELIX: 12.5000 mg | ORAL_SOLUTION | Freq: Four times a day (QID) | ORAL | Status: DC | PRN

## 2012-10-30 MED ORDER — MIDAZOLAM HCL 5 MG/5ML IJ SOLN
INTRAMUSCULAR | Status: DC | PRN
  Administered 2012-10-30: 2 mg via INTRAVENOUS

## 2012-10-30 MED ORDER — CEFAZOLIN SODIUM-DEXTROSE 2-3 GM-% IV SOLR
2.0000 g | Freq: Three times a day (TID) | INTRAVENOUS | Status: AC
  Administered 2012-10-30: 2 g via INTRAVENOUS
  Filled 2012-10-30: qty 50

## 2012-10-30 MED ORDER — ONDANSETRON HCL 4 MG PO TABS: 4.0000 mg | ORAL_TABLET | Freq: Four times a day (QID) | ORAL | Status: DC | PRN

## 2012-10-30 MED ORDER — METOCLOPRAMIDE HCL 5 MG/ML IJ SOLN
INTRAMUSCULAR | Status: DC | PRN
  Administered 2012-10-30: 5 mg via INTRAVENOUS

## 2012-10-30 MED ORDER — LIDOCAINE HCL (CARDIAC) 20 MG/ML IV SOLN
INTRAVENOUS | Status: DC | PRN
  Administered 2012-10-30: 50 mg via INTRAVENOUS

## 2012-10-30 MED ORDER — METRONIDAZOLE IN NACL 5-0.79 MG/ML-% IV SOLN
500.0000 mg | Freq: Three times a day (TID) | INTRAVENOUS | Status: AC
  Administered 2012-10-30: 500 mg via INTRAVENOUS
  Filled 2012-10-30: qty 100

## 2012-10-30 MED ORDER — NALOXONE HCL 0.4 MG/ML IJ SOLN: 0.4000 mg | INTRAMUSCULAR | Status: DC | PRN

## 2012-10-30 MED ORDER — BUPIVACAINE HCL (PF) 0.25 % IJ SOLN
INTRAMUSCULAR | Status: DC | PRN
  Administered 2012-10-30: 30 mL

## 2012-10-30 MED ORDER — SODIUM CHLORIDE 0.9 % IV SOLN
INTRAVENOUS | Status: DC
  Administered 2012-10-30 – 2012-10-31 (×2): via INTRAVENOUS

## 2012-10-30 MED ORDER — SODIUM CHLORIDE 0.9 % IJ SOLN: 9.0000 mL | INTRAMUSCULAR | Status: DC | PRN

## 2012-10-30 MED ORDER — METOCLOPRAMIDE HCL 5 MG/ML IJ SOLN
10.0000 mg | Freq: Once | INTRAMUSCULAR | Status: DC | PRN
  Administered 2012-10-30: 10 mg via INTRAVENOUS

## 2012-10-30 MED ORDER — MORPHINE SULFATE (PF) 1 MG/ML IV SOLN
INTRAVENOUS | Status: AC
  Filled 2012-10-30: qty 25

## 2012-10-30 MED ORDER — PHENYLEPHRINE HCL 10 MG/ML IJ SOLN
INTRAMUSCULAR | Status: DC | PRN
  Administered 2012-10-30 (×3): 40 ug via INTRAVENOUS

## 2012-10-30 MED ORDER — ROCURONIUM BROMIDE 100 MG/10ML IV SOLN
INTRAVENOUS | Status: DC | PRN
Start: 2012-10-30 — End: 2012-10-30
  Administered 2012-10-30: 50 mg via INTRAVENOUS

## 2012-10-30 MED ORDER — HEPARIN SODIUM (PORCINE) 5000 UNIT/ML IJ SOLN: 5000.0000 [IU] | Freq: Once | INTRAMUSCULAR | Status: AC

## 2012-10-30 MED ORDER — OXYCODONE HCL 5 MG/5ML PO SOLN: 5.0000 mg | Freq: Once | ORAL | Status: DC | PRN

## 2012-10-30 MED ORDER — ACETAMINOPHEN 10 MG/ML IV SOLN
1000.0000 mg | Freq: Four times a day (QID) | INTRAVENOUS | Status: AC
  Administered 2012-10-30 – 2012-10-31 (×4): 1000 mg via INTRAVENOUS
  Filled 2012-10-30 (×4): qty 100

## 2012-10-30 MED ORDER — ALVIMOPAN 12 MG PO CAPS
12.0000 mg | ORAL_CAPSULE | Freq: Two times a day (BID) | ORAL | Status: DC
  Administered 2012-10-31 – 2012-11-03 (×7): 12 mg via ORAL
  Filled 2012-10-30 (×11): qty 1

## 2012-10-30 MED ORDER — ONDANSETRON HCL 4 MG/2ML IJ SOLN
4.0000 mg | Freq: Four times a day (QID) | INTRAMUSCULAR | Status: DC | PRN
  Administered 2012-10-30 – 2012-11-02 (×4): 4 mg via INTRAVENOUS
  Filled 2012-10-30 (×4): qty 2

## 2012-10-30 MED ORDER — NEOSTIGMINE METHYLSULFATE 1 MG/ML IJ SOLN
INTRAMUSCULAR | Status: DC | PRN
  Administered 2012-10-30: 4 mg via INTRAVENOUS

## 2012-10-30 MED ORDER — MORPHINE SULFATE (PF) 1 MG/ML IV SOLN
INTRAVENOUS | Status: DC
  Administered 2012-10-30 – 2012-10-31 (×2): 4 mg via INTRAVENOUS
  Administered 2012-10-31: 5 mg via INTRAVENOUS
  Administered 2012-10-31: 7 mg via INTRAVENOUS
  Administered 2012-10-31: 5 mg via INTRAVENOUS
  Administered 2012-10-31: 7 mg via INTRAVENOUS
  Administered 2012-10-31: 1 mg via INTRAVENOUS
  Administered 2012-10-31: 16:00:00 via INTRAVENOUS
  Administered 2012-10-31: 5 mg via INTRAVENOUS
  Filled 2012-10-30 (×2): qty 25

## 2012-10-30 MED ORDER — SODIUM CHLORIDE 0.9 % IR SOLN
Status: DC | PRN
  Administered 2012-10-30 (×2): 1000 mL

## 2012-10-30 MED ORDER — HYDROMORPHONE HCL PF 1 MG/ML IJ SOLN
INTRAMUSCULAR | Status: AC
  Administered 2012-10-30: 0.25 mg via INTRAVENOUS
  Filled 2012-10-30: qty 1

## 2012-10-30 MED ORDER — ACETAMINOPHEN 325 MG PO TABS: 650.0000 mg | ORAL_TABLET | Freq: Four times a day (QID) | ORAL | Status: DC | PRN

## 2012-10-30 MED ORDER — KETOROLAC TROMETHAMINE 15 MG/ML IJ SOLN
15.0000 mg | Freq: Four times a day (QID) | INTRAMUSCULAR | Status: AC | PRN
  Administered 2012-10-31 – 2012-11-03 (×8): 15 mg via INTRAVENOUS
  Filled 2012-10-30 (×9): qty 1

## 2012-10-30 MED ORDER — DEXAMETHASONE SODIUM PHOSPHATE 4 MG/ML IJ SOLN
INTRAMUSCULAR | Status: DC | PRN
  Administered 2012-10-30: 4 mg via INTRAVENOUS

## 2012-10-30 MED ORDER — OXYCODONE HCL 5 MG PO TABS: 5.0000 mg | ORAL_TABLET | Freq: Once | ORAL | Status: DC | PRN

## 2012-10-30 MED ORDER — KETOROLAC TROMETHAMINE 30 MG/ML IJ SOLN
INTRAMUSCULAR | Status: AC
  Administered 2012-10-30: 15 mg
  Filled 2012-10-30: qty 1

## 2012-10-30 MED ORDER — VECURONIUM BROMIDE 10 MG IV SOLR
INTRAVENOUS | Status: DC | PRN
  Administered 2012-10-30: 2 mg via INTRAVENOUS

## 2012-10-30 MED ORDER — ARTIFICIAL TEARS OP OINT
TOPICAL_OINTMENT | OPHTHALMIC | Status: DC | PRN
  Administered 2012-10-30: 1 via OPHTHALMIC

## 2012-10-30 MED ORDER — AMPHETAMINE-DEXTROAMPHETAMINE 10 MG PO TABS
20.0000 mg | ORAL_TABLET | Freq: Every day | ORAL | Status: DC
  Administered 2012-10-31 – 2012-11-04 (×2): 20 mg via ORAL
  Filled 2012-10-30 (×3): qty 2

## 2012-10-30 MED ORDER — HEPARIN SODIUM (PORCINE) 5000 UNIT/ML IJ SOLN
5000.0000 [IU] | Freq: Three times a day (TID) | INTRAMUSCULAR | Status: DC
  Administered 2012-10-30 – 2012-11-04 (×14): 5000 [IU] via SUBCUTANEOUS
  Filled 2012-10-30 (×19): qty 1

## 2012-10-30 MED ORDER — HYDROMORPHONE HCL PF 1 MG/ML IJ SOLN
0.2500 mg | INTRAMUSCULAR | Status: DC | PRN
  Administered 2012-10-30: 0.25 mg via INTRAVENOUS
  Administered 2012-10-30: 0.5 mg via INTRAVENOUS

## 2012-10-30 MED ORDER — ONDANSETRON HCL 4 MG/2ML IJ SOLN: 4.0000 mg | Freq: Four times a day (QID) | INTRAMUSCULAR | Status: DC | PRN

## 2012-10-30 MED ORDER — FENTANYL CITRATE 0.05 MG/ML IJ SOLN
INTRAMUSCULAR | Status: DC | PRN
  Administered 2012-10-30: 50 ug via INTRAVENOUS
  Administered 2012-10-30 (×2): 100 ug via INTRAVENOUS

## 2012-10-30 MED ORDER — LACTATED RINGERS IV SOLN
INTRAVENOUS | Status: DC
  Administered 2012-10-30 (×2): via INTRAVENOUS

## 2012-10-30 MED ORDER — HEPARIN SODIUM (PORCINE) 5000 UNIT/ML IJ SOLN
INTRAMUSCULAR | Status: AC
  Administered 2012-10-30: 5000 [IU] via SUBCUTANEOUS
  Filled 2012-10-30: qty 1

## 2012-10-30 MED ORDER — DIPHENHYDRAMINE HCL 50 MG/ML IJ SOLN
12.5000 mg | Freq: Four times a day (QID) | INTRAMUSCULAR | Status: DC | PRN
  Administered 2012-10-30: 12.5 mg via INTRAVENOUS
  Filled 2012-10-30: qty 1

## 2012-10-30 MED ORDER — ONDANSETRON HCL 4 MG/2ML IJ SOLN
INTRAMUSCULAR | Status: DC | PRN
  Administered 2012-10-30: 4 mg via INTRAVENOUS

## 2012-10-30 MED ORDER — PROPOFOL 10 MG/ML IV BOLUS
INTRAVENOUS | Status: DC | PRN
  Administered 2012-10-30: 200 mg via INTRAVENOUS

## 2012-10-30 MED ORDER — BUPIVACAINE HCL (PF) 0.25 % IJ SOLN
INTRAMUSCULAR | Status: AC
  Filled 2012-10-30: qty 30

## 2012-10-30 MED ORDER — ACETAMINOPHEN 325 MG PO TABS
650.0000 mg | ORAL_TABLET | Freq: Four times a day (QID) | ORAL | Status: DC | PRN
  Filled 2012-10-30: qty 2

## 2012-10-30 MED ORDER — GLYCOPYRROLATE 0.2 MG/ML IJ SOLN
INTRAMUSCULAR | Status: DC | PRN
  Administered 2012-10-30: 0.6 mg via INTRAVENOUS

## 2012-10-30 MED ORDER — PEG 3350-KCL-NA BICARB-NACL 420 G PO SOLR
4000.0000 mL | Freq: Once | ORAL | Status: DC
  Filled 2012-10-30: qty 4000

## 2012-10-30 MED ORDER — ALVIMOPAN 12 MG PO CAPS
12.0000 mg | ORAL_CAPSULE | Freq: Once | ORAL | Status: AC
  Administered 2012-10-30: 12 mg via ORAL
  Filled 2012-10-30: qty 1

## 2012-10-30 SURGICAL SUPPLY — 84 items
ADH SKN CLS APL DERMABOND .7 (GAUZE/BANDAGES/DRESSINGS) ×1
ADH SKN CLS LQ APL DERMABOND (GAUZE/BANDAGES/DRESSINGS) ×1
APPLIER CLIP 5 13 M/L LIGAMAX5 (MISCELLANEOUS) ×2
APR CLP MED LRG 5 ANG JAW (MISCELLANEOUS) ×1
BLADE SURG 10 STRL SS (BLADE) ×2 IMPLANT
BLADE SURG ROTATE 9660 (MISCELLANEOUS) IMPLANT
CANISTER SUCTION 2500CC (MISCELLANEOUS) ×2 IMPLANT
CELLS DAT CNTRL 66122 CELL SVR (MISCELLANEOUS) IMPLANT
CHLORAPREP W/TINT 26ML (MISCELLANEOUS) ×2 IMPLANT
CLIP APPLIE 5 13 M/L LIGAMAX5 (MISCELLANEOUS) ×1 IMPLANT
CLOTH BEACON ORANGE TIMEOUT ST (SAFETY) ×2 IMPLANT
COVER MAYO STAND STRL (DRAPES) ×2 IMPLANT
COVER SURGICAL LIGHT HANDLE (MISCELLANEOUS) ×2 IMPLANT
DERMABOND ADHESIVE PROPEN (GAUZE/BANDAGES/DRESSINGS) ×1
DERMABOND ADVANCED (GAUZE/BANDAGES/DRESSINGS) ×1
DERMABOND ADVANCED .7 DNX12 (GAUZE/BANDAGES/DRESSINGS) ×1 IMPLANT
DERMABOND ADVANCED .7 DNX6 (GAUZE/BANDAGES/DRESSINGS) ×1 IMPLANT
DRAPE PROXIMA HALF (DRAPES) IMPLANT
DRAPE UTILITY 15X26 W/TAPE STR (DRAPE) ×6 IMPLANT
DRAPE WARM FLUID 44X44 (DRAPE) ×2 IMPLANT
DRSG OPSITE POSTOP 4X6 (GAUZE/BANDAGES/DRESSINGS) ×1 IMPLANT
ELECT BLADE 6.5 EXT (BLADE) IMPLANT
ELECT CAUTERY BLADE 6.4 (BLADE) ×2 IMPLANT
ELECT REM PT RETURN 9FT ADLT (ELECTROSURGICAL) ×2
ELECTRODE REM PT RTRN 9FT ADLT (ELECTROSURGICAL) ×1 IMPLANT
GEL ULTRASOUND 20GR AQUASONIC (MISCELLANEOUS) IMPLANT
GLOVE BIO SURGEON STRL SZ7 (GLOVE) ×8 IMPLANT
GLOVE BIO SURGEON STRL SZ7.5 (GLOVE) ×4 IMPLANT
GLOVE BIOGEL PI IND STRL 7.0 (GLOVE) IMPLANT
GLOVE BIOGEL PI IND STRL 7.5 (GLOVE) ×2 IMPLANT
GLOVE BIOGEL PI IND STRL 8 (GLOVE) ×2 IMPLANT
GLOVE BIOGEL PI INDICATOR 7.0 (GLOVE) ×4
GLOVE BIOGEL PI INDICATOR 7.5 (GLOVE) ×2
GLOVE BIOGEL PI INDICATOR 8 (GLOVE) ×2
GOWN STRL NON-REIN LRG LVL3 (GOWN DISPOSABLE) ×12 IMPLANT
GOWN STRL REIN XL XLG (GOWN DISPOSABLE) ×2 IMPLANT
KIT BASIN OR (CUSTOM PROCEDURE TRAY) ×2 IMPLANT
KIT ROOM TURNOVER OR (KITS) ×2 IMPLANT
LEGGING LITHOTOMY PAIR STRL (DRAPES) IMPLANT
LIGASURE IMPACT 36 18CM CVD LR (INSTRUMENTS) ×2 IMPLANT
NS IRRIG 1000ML POUR BTL (IV SOLUTION) ×2 IMPLANT
PAD ARMBOARD 7.5X6 YLW CONV (MISCELLANEOUS) ×4 IMPLANT
PAD SHARPS MAGNETIC DISPOSAL (MISCELLANEOUS) ×2 IMPLANT
PENCIL BUTTON HOLSTER BLD 10FT (ELECTRODE) ×2 IMPLANT
RELOAD PROXIMATE 75MM BLUE (ENDOMECHANICALS) ×2 IMPLANT
RETRACTOR WND ALEXIS 18 MED (MISCELLANEOUS) IMPLANT
RTRCTR WOUND ALEXIS 18CM MED (MISCELLANEOUS)
SCALPEL HARMONIC ACE (MISCELLANEOUS) ×2 IMPLANT
SCISSORS LAP 5X35 DISP (ENDOMECHANICALS) IMPLANT
SET IRRIG TUBING LAPAROSCOPIC (IRRIGATION / IRRIGATOR) ×2 IMPLANT
SLEEVE ENDOPATH XCEL 5M (ENDOMECHANICALS) ×2 IMPLANT
SPECIMEN JAR LARGE (MISCELLANEOUS) ×2 IMPLANT
SPONGE LAP 18X18 X RAY DECT (DISPOSABLE) IMPLANT
STAPLER GUN LINEAR PROX 60 (STAPLE) ×1 IMPLANT
STAPLER PROXIMATE 75MM BLUE (STAPLE) ×4 IMPLANT
STAPLER VISISTAT 35W (STAPLE) ×2 IMPLANT
SUCTION POOLE TIP (SUCTIONS) ×2 IMPLANT
SURGILUBE 2OZ TUBE FLIPTOP (MISCELLANEOUS) IMPLANT
SUT MNCRL AB 4-0 PS2 18 (SUTURE) ×3 IMPLANT
SUT PDS AB 1 CTX 36 (SUTURE) ×4 IMPLANT
SUT PROLENE 2 0 CT2 30 (SUTURE) IMPLANT
SUT PROLENE 2 0 KS (SUTURE) IMPLANT
SUT SILK 0 TIES 10X30 (SUTURE) IMPLANT
SUT SILK 2 0 (SUTURE)
SUT SILK 2 0 SH CR/8 (SUTURE) ×2 IMPLANT
SUT SILK 2-0 18XBRD TIE 12 (SUTURE) IMPLANT
SUT SILK 3 0 (SUTURE) ×2
SUT SILK 3 0 SH CR/8 (SUTURE) ×2 IMPLANT
SUT SILK 3-0 18XBRD TIE 12 (SUTURE) ×1 IMPLANT
SUT VIC AB 3-0 SH 8-18 (SUTURE) IMPLANT
SYR BULB IRRIGATION 50ML (SYRINGE) ×2 IMPLANT
SYS LAPSCP GELPORT 120MM (MISCELLANEOUS) ×2
SYSTEM LAPSCP GELPORT 120MM (MISCELLANEOUS) IMPLANT
TOWEL OR 17X26 10 PK STRL BLUE (TOWEL DISPOSABLE) ×4 IMPLANT
TRAY FOLEY CATH 14FRSI W/METER (CATHETERS) ×2 IMPLANT
TRAY LAPAROSCOPIC (CUSTOM PROCEDURE TRAY) ×2 IMPLANT
TRAY PROCTOSCOPIC FIBER OPTIC (SET/KITS/TRAYS/PACK) IMPLANT
TROCAR XCEL BLUNT TIP 100MML (ENDOMECHANICALS) IMPLANT
TROCAR XCEL NON-BLD 11X100MML (ENDOMECHANICALS) IMPLANT
TROCAR XCEL NON-BLD 5MMX100MML (ENDOMECHANICALS) ×2 IMPLANT
TUBE CONNECTING 12X1/4 (SUCTIONS) ×2 IMPLANT
TUBING FILTER THERMOFLATOR (ELECTROSURGICAL) ×2 IMPLANT
WATER STERILE IRR 1000ML POUR (IV SOLUTION) ×2 IMPLANT
YANKAUER SUCT BULB TIP NO VENT (SUCTIONS) ×2 IMPLANT

## 2012-10-30 NOTE — H&P (View-Only) (Signed)
Patient ID: Jocelyn Howell, female   DOB: 01/09/1961, 52 y.o.   MRN: [id number]  Chief Complaint  Patient presents with  . New Evaluation    eval near obs colon mass    HPI Jocelyn Howell is a 52 y.o. female.  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  She underwent first screening colonoscopy at age 52 with Dr Schooler. She was found to have a tortuous colon and a partially obstructing mass in the distal ascending colon that was tattoed. She had another small polyp in the transverse colon which was completely excised. 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  This is not resectable endoscopically and she was referred. She has some intermittent right sided abdominal cramping and increased bs but otherwise has no change in either stool caliber or bowel habits.  No n/v/weight loss.  Past Medical History  Diagnosis Date  . Fibroids     Past Surgical History  Procedure Laterality Date  . Cesarean section    . Endometrial ablation    . Laparoscopy      History reviewed. No pertinent family history.  Social History History  Substance Use Topics  . Smoking status: Never Smoker   . Smokeless tobacco: Not on file  . Alcohol Use: Yes    No Known Allergies  Current Outpatient Prescriptions  Medication Sig Dispense Refill  . amphetamine-dextroamphetamine (ADDERALL XR) 10 MG 24 hr capsule Take 10 mg by mouth every morning.      . diclofenac (VOLTAREN) 75 MG EC tablet Take 1 tablet (75 mg total) by mouth 2 (two) times daily.  14 tablet  0  . fluconazole (DIFLUCAN) 150 MG tablet Take 1 tablet (150 mg total) by mouth once.  1 tablet  2  . loratadine (CLARITIN) 10 MG tablet Take 1 tablet (10 mg total) by mouth daily.  30 tablet  1  . nitrofurantoin, macrocrystal-monohydrate, (MACROBID) 100 MG capsule Take 1 capsule (100 mg total) by mouth 2 (two) times daily. X 7 days  14  capsule  0  . tranexamic acid (LYSTEDA) 650 MG TABS Take 2 tablets (1,300 mg total) by mouth 3 (three) times daily.  30 tablet  0   No current facility-administered medications for this visit.    Review of Systems Review of Systems  Constitutional: Negative for fever, chills and unexpected weight change.  HENT: Negative for hearing loss, congestion, sore throat, trouble swallowing and voice change.   Eyes: Negative for visual disturbance.  Respiratory: Negative for cough and wheezing.   Cardiovascular: Negative for chest pain, palpitations and leg swelling.  Gastrointestinal: Negative for nausea, vomiting, abdominal pain, diarrhea, constipation, blood in stool, abdominal distention and anal bleeding.  Genitourinary: Negative for hematuria, vaginal bleeding and difficulty urinating.  Musculoskeletal: Negative for arthralgias.  Skin: Negative for rash and wound.  Neurological: Negative for seizures, syncope and headaches.  Hematological: Negative for adenopathy. Does not bruise/bleed easily.  Psychiatric/Behavioral: Negative for confusion.    Blood pressure 124/70, pulse 80, resp. rate 16, height 5' 8" (1.727 m), weight 159 lb 3.2 oz (72.213 kg).  Physical Exam Physical Exam  Vitals reviewed. Constitutional: She is oriented to person, place, and time. She appears well-developed and well-nourished.  Eyes: No scleral icterus.  Neck: Neck supple.  Cardiovascular: Normal rate, regular rhythm and normal heart sounds.   Pulmonary/Chest: Effort normal and breath sounds normal.   She has no wheezes. She has no rales.  Abdominal: Soft. Bowel sounds are normal. She exhibits no mass. There is no tenderness.  Lymphadenopathy:    She has no cervical adenopathy.  Neurological: She is oriented to person, place, and time.    Data Reviewed Colonoscopy report from Dr Schooler  Assessment    Right colon mass     Plan    Laparoscopic right colectomy     Cancer of the Colon, Surgical  Treatment  Based on the location and type of colon cancer that you have, we have determined that surgical removal of this part of your colon will be part of your treatment.  This will treat symptoms of bleeding or blockage that you may be experiencing.  I will do everything that I can to remove your entire tumor safely. To do this, some normal tissue must also be removed to give you the best chance for a cure. The following will help describe what happens when you have this surgery. We discussed although there is not cancer in current path that is possibility given size. Hopefully this will be benign though.  TREATMENT Surgery is the most common treatment for colorectal cancer.It treats the cancer in the colon and the area close to the tumor by removing the tumor and some of the healthy tissue around it. This tissue includes an apron of tissue with lymph nodes and blood vessels.  We usually use laparoscopy (smaller incisions) to help minimize the size of the larger incision on your abdomen.  However, even if we use laparoscopy primarily, we still need to make an incision large enough to bring the two ends of the colon out to reconnect them.  For tumors that are very large or very adherent to the adjacent structures, we may have to make a larger incision to do your operation safely. The surgeon checks the rest of the abdomen, the intestine and the liver to see if the cancer has spread.  There may be microscopic disease present that we are unable to detect.    The part of colon that we plan to remove in your case is the right side.  WHAT HAPPENS AFTER SURGERY: After surgery, you will go first to the recovery room, then to your room.   You will have  tubes and lines in place which is standard for this operation.  You will have several IVs..  YOU MIGHT NOT BE ABLE TO EAT FOR SEVERAL DAYS AFTER SURGERY.  You will have a catheter in your bladder for around 1 days.  You will have compression stockings on your  legs to decrease the risk of blood clots.    We will address your pain in several ways.  We will use an IV pain pump called a "PCA," or Patient Controlled Analgesia.  This allows you to press a button and immediately receive a dose of pain medication without waiting for a nurse.  We also use IV Tylenol and sometimes IV Toradol which is similar to ibuprofen.   I use doses and medications that work for the majority of people, but you may need an adjustment to the dose or type of medicine if your pain is not adequately controlled.  Your throat may be sore, in which case you may need a throat spray or lozenges.  Some patients become disoriented with the pain medication and may need adjustments due to that.    We will ask you to get out of bed the day after surgery   in order to maximize your chances of not having complications.  Your risk of pneumonia and blood clots is lower with walking and sitting in the chair.  We will also ask you to perform breathing exercises.  We will also ask to you walk in your room and in the halls for the above reasons, but also in order for you to keep up your strength.     POSSIBLE COMPLICATIONS This is a major operation and includes complications listed below: Bleeding Infection (5-20%) Possible wound complications such as hernia Damage to adjacent structures Leak of surgical connections, which can lead to other surgeries and possibly an ostomy. (1-5%) Possible need for other procedures, such as abscess drains in radiology  Possible prolonged hospital stay Possible diarrhea from removal of part of the colon. Possible constipation from narcotics.    Prolonged fatigue/weakness/appetite MOST PATIENTS' ENERGY LEVEL IS NOT BACK TO NORMAL FOR AT LEAST 2-4 MONTHS.  OLDER PATIENTS MAY FEEL WEAK FOR LONGER PERIODS OF TIME.   Difficulty with eating or post operative nausea  Possible early recurrence of cancer Possible complications of your medical problems such as heart disease  or arrhythmias. Death (less than 1%)  All possible complications are not listed, just the most common.    FURTHER INFORMATION? Please ask questions if you find something that we did not discuss in the office and would like more information.  If you would like another appointment if you have many questions or if your family members would like to come as well, please contact the office.        Ilamae Geng 10/22/2012, 3:43 PM    

## 2012-10-30 NOTE — Anesthesia Procedure Notes (Signed)
Procedure Name: Intubation Date/Time: 10/30/2012 9:56 AM Performed by: Jefm Miles E Pre-anesthesia Checklist: Patient identified, Timeout performed, Emergency Drugs available, Suction available and Patient being monitored Patient Re-evaluated:Patient Re-evaluated prior to inductionOxygen Delivery Method: Circle system utilized Preoxygenation: Pre-oxygenation with 100% oxygen Intubation Type: IV induction Ventilation: Mask ventilation without difficulty Laryngoscope Size: Mac and 3 Grade View: Grade I Tube type: Oral Tube size: 7.0 mm Number of attempts: 1 Airway Equipment and Method: Stylet Placement Confirmation: ETT inserted through vocal cords under direct vision,  breath sounds checked- equal and bilateral and positive ETCO2 Secured at: 21 cm Tube secured with: Tape Dental Injury: Teeth and Oropharynx as per pre-operative assessment

## 2012-10-30 NOTE — Interval H&P Note (Signed)
History and Physical Interval Note:  10/30/2012 9:25 AM  Jocelyn Howell  has presented today for surgery, with the diagnosis of colon mass  The various methods of treatment have been discussed with the patient and family. After consideration of risks, benefits and other options for treatment, the patient has consented to  Procedure(s): LAPAROSCOPIC PARTIAL COLECTOMY (Right) as a surgical intervention .  The patient's history has been reviewed, patient examined, no change in status, stable for surgery.  I have reviewed the patient's chart and labs.  Questions were answered to the patient's satisfaction.     [name]

## 2012-10-30 NOTE — Anesthesia Preprocedure Evaluation (Addendum)
Anesthesia Evaluation  Patient identified by MRN, date of birth, ID band Patient awake    Reviewed: Allergy & Precautions, H&P , NPO status , Patient's Chart, lab work & pertinent test results, reviewed documented beta blocker date and time   History of Anesthesia Complications (+) PONV  Airway Mallampati: II TM Distance: >3 FB Neck ROM: full    Dental  (+) Teeth Intact and Dental Advisory Given   Pulmonary neg pulmonary ROS,  breath sounds clear to auscultation        Cardiovascular negative cardio ROS  + dysrhythmias + Valvular Problems/Murmurs Rhythm:regular     Neuro/Psych PSYCHIATRIC DISORDERS negative neurological ROS     GI/Hepatic negative GI ROS, Neg liver ROS,   Endo/Other  negative endocrine ROS  Renal/GU negative Renal ROS  negative genitourinary   Musculoskeletal   Abdominal   Peds  Hematology  (+) anemia ,   Anesthesia Other Findings See surgeon's H&P   Reproductive/Obstetrics negative OB ROS                          Anesthesia Physical Anesthesia Plan  ASA: II  Anesthesia Plan: General   Post-op Pain Management:    Induction: Intravenous  Airway Management Planned: Oral ETT  Additional Equipment:   Intra-op Plan:   Post-operative Plan: Extubation in OR  Informed Consent: I have reviewed the patients History and Physical, chart, labs and discussed the procedure including the risks, benefits and alternatives for the proposed anesthesia with the patient or authorized representative who has indicated his/her understanding and acceptance.   Dental Advisory Given  Plan Discussed with: CRNA and Surgeon  Anesthesia Plan Comments:         Anesthesia Quick Evaluation

## 2012-10-30 NOTE — Anesthesia Postprocedure Evaluation (Signed)
Anesthesia Post Note  Patient: Jocelyn Howell  Procedure(s) Performed: Procedure(s) (LRB): LAPAROSCOPIC PARTIAL COLECTOMY (Right)  Anesthesia type: General  Patient location: PACU  Post pain: Pain level controlled  Post assessment: Patient's Cardiovascular Status Stable  Last Vitals:  Filed Vitals:   10/30/12 1330  BP: 122/69  Pulse: 68  Temp:   Resp: 16    Post vital signs: Reviewed and stable  Level of consciousness: alert  Complications: No apparent anesthesia complications

## 2012-10-30 NOTE — Preoperative (Signed)
Beta Blockers   Reason not to administer Beta Blockers:Not Applicable 

## 2012-10-30 NOTE — Transfer of Care (Signed)
Immediate Anesthesia Transfer of Care Note  Patient: Jocelyn Howell  Procedure(s) Performed: Procedure(s) with comments: LAPAROSCOPIC PARTIAL COLECTOMY (Right) - removal right colon  Patient Location: PACU  Anesthesia Type:General  Level of Consciousness: lethargic and responds to stimulation  Airway & Oxygen Therapy: Patient Spontanous Breathing and Patient connected to nasal cannula oxygen  Post-op Assessment: Report given to PACU RN  Post vital signs: Reviewed and stable  Complications: No apparent anesthesia complications

## 2012-10-30 NOTE — Op Note (Signed)
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 Harden Mo Asst.: Dr. Axel Filler Anesthesia: Gen. Estimated blood loss: Minimal Specimens: Right colon to pathology Complications: None Drains: None Sponge and needle count correct x2 at operation Disposition to recovery in stable condition  Indications: This is a healthy 52 year old female in order for screening colonoscopy that showed a partially obstructing mass that was tattooed and noted to be in her ascending colon on colonoscopy. I saw her and we discussed a laparoscopic right colectomy. 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 We discussed the risks and benefits prior to beginning.  Procedure: After informed consent was obtained, patient was taken to the OR.  She underwent a prep at home.  She was given ancef and flagyl along with sq heparin and entereg prior to beginning.  She was placed under general anesthesia without complication.  A foley catheter and an orogastric tube were placed.  Her abdomen was the prepped and draped in the standard sterile surgical fashion.  A surgical timeout was performed.    I infiltrated Marcaine in the left upper quadrant. I made an incision in axis the peritoneum using a 5 mm Optiview trocar under direct vision. This was done without injury. I then insufflated the abdomen to 15 mm mercury pressure. I then inserted 3 further 5 mm trocars in the suprapubic region and left lower quadrant and right lower quadrant. These were all inserted under direct vision without complication. I then explored her right colon. The tattoo from her biopsy was located near the hepatic flexure. I then used the harmonic scalpel to take down the white line of Toldt and medialized the entire colon. I used a Harmonic scalpel to  then take down the hepatic flexure. I viewed the duodenum and this was not injured during this portion of the procedure. Once I had done this the colon was completely medialized. I then elected to make a 7 cm incision surrounding the umbilicus. She had a prior umbilical hernia that was included in this incision. I then inserted a wound protector. I brought this colon out. The mass was palpable. I then divided the terminal ileum with the GIA stapler. I then divided the proximal transverse colon after the hepatic flexure. I then used the LigaSure device to divide the colonic mesentery. I did oversew the right colic vessels with a 2-0 silk suture. This was then passed off the table as a specimen. I plenty of margin on either side of this. Once I had done this I then brought the small bowel up to the transverse colon. I used 2-0 silk to approximate this. I then made enterotomies in both. I used a GIA stapler to create a common enterotomy. This was hemostatic. I then closed the common defect with a TX stapler. I oversewed this with 2-0 silk. I placed 2 2-0 silk stitches at the apex of the anastomosis. This was patent and hemostatic. I closed the mesenteric defect with 3-0 silk sutures. I then placed this back in the abdomen. I then closed this incision with #1 PDS. This was irrigated. The umbilical hernia was closed along with the incision. I then stapled this incision close. I then reinsufflated the abdomen. I placed the omentum overlying the anastomosis. This area was hemostatic. All the bowel was in good position. I then desufflated the abdomen and removed all the trocars. I closed the trocar sites  with 4-0 Monocryl and Dermabond. She tolerated as well as extubated and transferred to recovery stable.

## 2012-10-31 ENCOUNTER — Encounter (HOSPITAL_COMMUNITY): Payer: Self-pay | Admitting: General Surgery

## 2012-10-31 LAB — BASIC METABOLIC PANEL
Chloride: 99 mEq/L (ref 96–112)
GFR calc Af Amer: >90 mL/min (ref >90)
Potassium: 4.2 mEq/L (ref 3.5–5.1)
Sodium: 137 mEq/L (ref 135–145)

## 2012-10-31 LAB — CBC
HCT: 33.9 % — ABNORMAL LOW (ref 36.0–46.0)
Hemoglobin: 10.9 g/dL — ABNORMAL LOW (ref 12.0–15.0)
RBC: 4.17 MIL/uL (ref 3.87–5.11)
WBC: 9.1 10*3/uL (ref 4.0–10.5)

## 2012-10-31 MED ORDER — KCL IN DEXTROSE-NACL 20-5-0.45 MEQ/L-%-% IV SOLN
INTRAVENOUS | Status: DC
  Administered 2012-10-31 – 2012-11-03 (×5): via INTRAVENOUS
  Filled 2012-10-31 (×7): qty 1000

## 2012-10-31 MED ORDER — DIPHENHYDRAMINE HCL 50 MG/ML IJ SOLN
12.5000 mg | Freq: Four times a day (QID) | INTRAMUSCULAR | Status: DC | PRN
  Filled 2012-10-31: qty 1

## 2012-10-31 MED ORDER — DIPHENHYDRAMINE HCL 12.5 MG/5ML PO ELIX: 12.5000 mg | ORAL_SOLUTION | Freq: Four times a day (QID) | ORAL | Status: DC | PRN

## 2012-10-31 MED ORDER — HYDROMORPHONE 0.3 MG/ML IV SOLN
INTRAVENOUS | Status: DC
  Administered 2012-10-31: via INTRAVENOUS
  Administered 2012-11-01: 0.67 mg via INTRAVENOUS
  Administered 2012-11-01: 0.2 mg via INTRAVENOUS
  Administered 2012-11-01: 0.7 mg via INTRAVENOUS
  Administered 2012-11-02: 0.599 mg via INTRAVENOUS
  Administered 2012-11-02: 0.2 mg via INTRAVENOUS
  Filled 2012-10-31: qty 25

## 2012-10-31 MED ORDER — NALOXONE HCL 0.4 MG/ML IJ SOLN: 0.4000 mg | INTRAMUSCULAR | Status: DC | PRN

## 2012-10-31 MED ORDER — SODIUM CHLORIDE 0.9 % IJ SOLN: 9.0000 mL | INTRAMUSCULAR | Status: DC | PRN

## 2012-10-31 NOTE — Progress Notes (Signed)
1 Day Post-Op  Subjective: Feels well, pain controlled, up and around , voiding  Objective: Vital signs in last 24 hours: Temp:  [97.4 F (36.3 C)-98.7 F (37.1 C)] 98.6 F (37 C) (07/10 0535) Pulse Rate:  [55-78] 78 (07/10 0535) Resp:  [11-20] 17 (07/10 0807) BP: (115-134)/(62-77) 116/65 mmHg (07/10 0535) SpO2:  [98 %-100 %] 100 % (07/10 0807) Weight:  [159 lb 1.6 oz (72.167 kg)] 159 lb 1.6 oz (72.167 kg) (07/09 1410) Last BM Date: 10/29/12  Intake/Output from previous day: 07/09 0701 - 07/10 0700 In: 3896.7 [I.V.:2996.7; IV Piggyback:900] Out: 2480 [Urine:2450; Blood:30] Intake/Output this shift:    General appearance: no distress Resp: clear to auscultation bilaterally Cardio: regular rate and rhythm GI: incisions clean, dressing intact, some bs present, approp tender  Lab Results:   Recent Labs  10/30/12 1605 10/31/12 0415  WBC 13.1* 9.1  HGB 11.4* 10.9*  HCT 34.9* 33.9*  PLT 301 309   BMET  Recent Labs  10/29/12 1016 10/30/12 1605 10/31/12 0415  NA 140  --  137  K 4.0  --  4.2  CL 105  --  99  CO2 28  --  25  GLUCOSE 86  --  86  BUN 13  --  6  CREATININE 0.81 0.65 0.83  CALCIUM 9.1  --  9.1   PT/INR No results found for this basename: LABPROT, INR,  in the last 72 hours ABG No results found for this basename: PHART, PCO2, PO2, HCO3,  in the last 72 hours  Studies/Results: No results found.  Anti-infectives: Anti-infectives   Start     Dose/Rate Route Frequency Ordered Stop   10/30/12 1800  ceFAZolin (ANCEF) IVPB 2 g/50 mL premix     2 g 100 mL/hr over 30 Minutes Intravenous 3 times per day 10/30/12 1452 10/30/12 1825   10/30/12 1800  metroNIDAZOLE (FLAGYL) IVPB 500 mg     500 mg 100 mL/hr over 60 Minutes Intravenous Every 8 hours 10/30/12 1452 10/30/12 2024   10/30/12 0600  ceFAZolin (ANCEF) IVPB 2 g/50 mL premix     2 g 100 mL/hr over 30 Minutes Intravenous On call to O.R. 10/29/12 1439 10/30/12 0957   10/30/12 0600  metroNIDAZOLE  (FLAGYL) IVPB 500 mg     500 mg 100 mL/hr over 60 Minutes Intravenous On call to O.R. 10/29/12 1439 10/30/12 1000      Assessment/Plan: POD 1 lap right colon 1. Cont pca today 2. pulm toilet 3. Labs all fine, cont iv fluids 4. Heparin, entereg, scds 5. Will let have some clears 6. Path pending  [facility] 10/31/2012

## 2012-10-31 NOTE — Progress Notes (Signed)
Came to visit with Mrs Speranza on behalf of [employer] to [employer] program for [employer] employees with [employer]. No identifiable needs at this time.  Raiford Noble, MSN-Ed, RN,BSN- [facility] [phone]

## 2012-10-31 NOTE — Progress Notes (Signed)
Patient ID: Jocelyn Howell, female   DOB: [birth year], 52 y.o.   MRN: [id number]  Social visit this evening. Doing well postop. Appreciate Dr. Doreen Salvage care of [name].

## 2012-11-01 NOTE — Progress Notes (Signed)
2 Days Post-Op  Subjective: Some bloating, ambulating, voiding, tol some clears, pain controlled  Objective: Vital signs in last 24 hours: Temp:  [97.9 F (36.6 C)-98.7 F (37.1 C)] 98.1 F (36.7 C) (07/11 0546) Pulse Rate:  [61-89] 69 (07/11 0546) Resp:  [10-25] 10 (07/11 0546) BP: (113-138)/(64-78) 138/78 mmHg (07/11 0546) SpO2:  [97 %-100 %] 99 % (07/11 0546) Last BM Date: 10/29/12  Intake/Output from previous day: 07/10 0701 - 07/11 0700 In: 2378.8 [P.O.:970; I.V.:1408.8] Out: 600 [Urine:600] Intake/Output this shift: Total I/O In: -  Out: 400 [Urine:400]  General appearance: no distress Resp: clear to auscultation bilaterally Cardio: regular rate and rhythm GI: mild distended, some bs, wounds clean  Lab Results:   Recent Labs  10/30/12 1605 10/31/12 0415  WBC 13.1* 9.1  HGB 11.4* 10.9*  HCT 34.9* 33.9*  PLT 301 309   BMET  Recent Labs  10/29/12 1016 10/30/12 1605 10/31/12 0415  NA 140  --  137  K 4.0  --  4.2  CL 105  --  99  CO2 28  --  25  GLUCOSE 86  --  86  BUN 13  --  6  CREATININE 0.81 0.65 0.83  CALCIUM 9.1  --  9.1    Anti-infectives: Anti-infectives   Start     Dose/Rate Route Frequency Ordered Stop   10/30/12 1800  ceFAZolin (ANCEF) IVPB 2 g/50 mL premix     2 g 100 mL/hr over 30 Minutes Intravenous 3 times per day 10/30/12 1452 10/30/12 1825   10/30/12 1800  metroNIDAZOLE (FLAGYL) IVPB 500 mg     500 mg 100 mL/hr over 60 Minutes Intravenous Every 8 hours 10/30/12 1452 10/30/12 2024   10/30/12 0600  ceFAZolin (ANCEF) IVPB 2 g/50 mL premix     2 g 100 mL/hr over 30 Minutes Intravenous On call to O.R. 10/29/12 1439 10/30/12 0957   10/30/12 0600  metroNIDAZOLE (FLAGYL) IVPB 500 mg     500 mg 100 mL/hr over 60 Minutes Intravenous On call to O.R. 10/29/12 1439 10/30/12 1000      Assessment/Plan: POD 2 lap right colon 1. pca dilaudid, toradol 2. Await ileus resolve, cont clears, entereg 3. Recheck labs in am 4. pulm toilet 5.  subq heparin, scds 6 path pending  [facility] 11/01/2012

## 2012-11-02 LAB — BASIC METABOLIC PANEL
CO2: 31 mEq/L (ref 19–32)
Calcium: 8.4 mg/dL (ref 8.4–10.5)
Creatinine, Ser: 0.68 mg/dL (ref 0.50–1.10)
Glucose, Bld: 115 mg/dL — ABNORMAL HIGH (ref 70–99)

## 2012-11-02 LAB — CBC
MCH: 25.9 pg — ABNORMAL LOW (ref 26.0–34.0)
MCV: 81.2 fL (ref 78.0–100.0)
Platelets: 278 10*3/uL (ref 150–400)
RBC: 3.98 MIL/uL (ref 3.87–5.11)

## 2012-11-02 MED ORDER — HYDROMORPHONE HCL PF 1 MG/ML IJ SOLN
1.0000 mg | INTRAMUSCULAR | Status: DC | PRN
  Administered 2012-11-02: 0.5 mg via INTRAVENOUS
  Administered 2012-11-03: 1 mg via INTRAVENOUS
  Filled 2012-11-02 (×2): qty 1

## 2012-11-02 MED ORDER — DIPHENHYDRAMINE HCL 50 MG/ML IJ SOLN
25.0000 mg | Freq: Every evening | INTRAMUSCULAR | Status: DC | PRN
  Administered 2012-11-02: 25 mg via INTRAVENOUS
  Filled 2012-11-02: qty 1

## 2012-11-02 NOTE — Progress Notes (Signed)
3 Days Post-Op  Subjective: No n/v pain controlled, no flatus yet, ambulating  Objective: Vital signs in last 24 hours: Temp:  [98.1 F (36.7 C)-98.4 F (36.9 C)] 98.4 F (36.9 C) (07/12 0508) Pulse Rate:  [74-82] 82 (07/12 0508) Resp:  [8-16] 10 (07/12 0825) BP: (117-133)/(64-76) 120/64 mmHg (07/12 0508) SpO2:  [96 %-100 %] 98 % (07/12 0825) Last BM Date: 10/29/12  Intake/Output from previous day: 07/11 0701 - 07/12 0700 In: 2034.3 [P.O.:840; I.V.:1194.3] Out: 2000 [Urine:2000] Intake/Output this shift:    General appearance: no distress GI: bs improving, wound clean without infection, soft, approp tender  Lab Results:   Recent Labs  10/31/12 0415 11/02/12 0546  WBC 9.1 3.7*  HGB 10.9* 10.3*  HCT 33.9* 32.3*  PLT 309 278   BMET  Recent Labs  10/31/12 0415 11/02/12 0546  NA 137 136  K 4.2 3.7  CL 99 101  CO2 25 31  GLUCOSE 86 115*  BUN 6 6  CREATININE 0.83 0.68  CALCIUM 9.1 8.4   PT/INR No results found for this basename: LABPROT, INR,  in the last 72 hours ABG No results found for this basename: PHART, PCO2, PO2, HCO3,  in the last 72 hours  Studies/Results: No results found.  Anti-infectives: Anti-infectives   Start     Dose/Rate Route Frequency Ordered Stop   10/30/12 1800  ceFAZolin (ANCEF) IVPB 2 g/50 mL premix     2 g 100 mL/hr over 30 Minutes Intravenous 3 times per day 10/30/12 1452 10/30/12 1825   10/30/12 1800  metroNIDAZOLE (FLAGYL) IVPB 500 mg     500 mg 100 mL/hr over 60 Minutes Intravenous Every 8 hours 10/30/12 1452 10/30/12 2024   10/30/12 0600  ceFAZolin (ANCEF) IVPB 2 g/50 mL premix     2 g 100 mL/hr over 30 Minutes Intravenous On call to O.R. 10/29/12 1439 10/30/12 0957   10/30/12 0600  metroNIDAZOLE (FLAGYL) IVPB 500 mg     500 mg 100 mL/hr over 60 Minutes Intravenous On call to O.R. 10/29/12 1439 10/30/12 1000      Assessment/Plan: POD 3 lap right colon 1. Will dc pca today, cont toradol and iv backup 2. pulm  toilet 3. Ileus resolving I think , cont clears 4. Sq heparin, scds 5. Path is benign and we discussed  [facility] 11/02/2012

## 2012-11-03 MED ORDER — DIPHENHYDRAMINE HCL 12.5 MG/5ML PO ELIX
12.5000 mg | ORAL_SOLUTION | Freq: Every evening | ORAL | Status: AC | PRN
  Administered 2012-11-03: 25 mg via ORAL
  Filled 2012-11-03: qty 10

## 2012-11-03 MED ORDER — OXYCODONE-ACETAMINOPHEN 5-325 MG PO TABS
1.0000 | ORAL_TABLET | ORAL | Status: DC | PRN
  Administered 2012-11-03: 1 via ORAL
  Administered 2012-11-03: 2 via ORAL
  Administered 2012-11-03 – 2012-11-04 (×3): 1 via ORAL
  Filled 2012-11-03 (×3): qty 2
  Filled 2012-11-03 (×2): qty 1

## 2012-11-03 MED ORDER — DOCUSATE SODIUM 100 MG PO CAPS
100.0000 mg | ORAL_CAPSULE | Freq: Two times a day (BID) | ORAL | Status: DC
  Administered 2012-11-03 – 2012-11-04 (×3): 100 mg via ORAL
  Filled 2012-11-03 (×3): qty 1

## 2012-11-03 NOTE — Progress Notes (Signed)
4 Days Post-Op  Subjective: Passed some flatus, no n/v tol clears, ambulating voiding, pain controlled  Objective: Vital signs in last 24 hours: Temp:  [97.8 F (36.6 C)-98.1 F (36.7 C)] 97.8 F (36.6 C) (07/13 0645) Pulse Rate:  [72-81] 72 (07/13 0645) Resp:  [10-20] 20 (07/13 0645) BP: (105-141)/(51-76) 105/51 mmHg (07/13 0645) SpO2:  [98 %-99 %] 98 % (07/13 0645) Last BM Date:  (pre op)  Intake/Output from previous day: 07/12 0701 - 07/13 0700 In: 2315 [P.O.:490; I.V.:1825] Out: -  Intake/Output this shift:    General appearance: no distress Resp: clear to auscultation bilaterally Cardio: regular rate and rhythm GI: bs present, wound clean, soft, approp tender  Lab Results:   Recent Labs  11/02/12 0546  WBC 3.7*  HGB 10.3*  HCT 32.3*  PLT 278   BMET  Recent Labs  11/02/12 0546  NA 136  K 3.7  CL 101  CO2 31  GLUCOSE 115*  BUN 6  CREATININE 0.68  CALCIUM 8.4    Assessment/Plan: POD 4 lap right colon 1. Po pain meds 2. pulm toilet 3. Fulls, advance as tolerated 4. Sq heparin, scds  [facility] 11/03/2012

## 2012-11-03 NOTE — Progress Notes (Signed)
IV infiltrated, refused IV restart at this time.  Had a bm this afternoon and was advanced to soft diet and good pain control with Percocet.

## 2012-11-04 MED ORDER — DSS 100 MG PO CAPS: 100.0000 mg | ORAL_CAPSULE | Freq: Two times a day (BID) | ORAL | Status: AC

## 2012-11-04 MED ORDER — OXYCODONE-ACETAMINOPHEN 5-325 MG PO TABS: 1.0000 | ORAL_TABLET | ORAL | Status: AC | PRN

## 2012-11-04 NOTE — Discharge Summary (Signed)
Physician Discharge Summary  Patient ID: Jocelyn Howell MRN: [id number] DOB/AGE: [birth year] 51 y.o.  Admit date: 10/30/2012 Discharge date: 11/04/2012  Admission Diagnoses: 1. Colon polyp unresectable endoscopically  Discharge Diagnoses:  Active Problems:   * No active hospital problems. *   Discharged Condition: good  Hospital Course: 25 yof found to have ascending colon polyp not amenable to endoscopic removal.  She was admitted and underwent laparoscopic right colectomy without difficulty.  Postoperatively she was maintained on sq heparin, entereg.  Her ileus resolved and her diet was advanced. Her wounds remained without infection.  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  Consults: None  Significant Diagnostic Studies: none  Treatments: surgery: laparoscopic right colectomy   Disposition: 01-Home or Self Care     Medication List    STOP taking these medications       polyethylene glycol 236 G solution  Commonly known as:  GOLYTELY      TAKE these medications       amphetamine-dextroamphetamine 20 MG tablet  Commonly known as:  ADDERALL  Take 20 mg by mouth daily.     DSS 100 MG Caps  Take 100 mg by mouth 2 (two) times daily.     oxyCODONE-acetaminophen 5-325 MG per tablet  Commonly known as:  PERCOCET/ROXICET  Take 1-2 tablets by mouth every 4 (four) hours as needed.     Vitamin D (Ergocalciferol) 50000 UNITS Caps  Commonly known as:  DRISDOL  Take 50,000 Units by mouth every 7 (seven) days.           Follow-up Information   Follow up with [facility], MD In 2 weeks.   Contact information:   [address] [place] [address] [phone]       Signed: Emelia Loron 11/04/2012, 10:38 AM

## 2012-11-04 NOTE — Progress Notes (Signed)
5 Days Post-Op  Subjective: tol diet, having bms and flatus, ambulating pain controlled  Objective: Vital signs in last 24 hours: Temp:  [98.3 F (36.8 C)-98.7 F (37.1 C)] 98.7 F (37.1 C) (07/14 0547) Pulse Rate:  [72-93] 72 (07/14 0547) Resp:  [18-19] 18 (07/14 0547) BP: (124-131)/(73-81) 131/73 mmHg (07/14 0547) SpO2:  [97 %-100 %] 99 % (07/14 0547) Last BM Date: 11/03/12  Intake/Output from previous day:   Intake/Output this shift:   Abdomen soft, nontender, wounds clean, bs present  Lab Results:   Recent Labs  11/02/12 0546  WBC 3.7*  HGB 10.3*  HCT 32.3*  PLT 278   BMET  Recent Labs  11/02/12 0546  NA 136  K 3.7  CL 101  CO2 31  GLUCOSE 115*  BUN 6  CREATININE 0.68  CALCIUM 8.4   PT/INR No results found for this basename: LABPROT, INR,  in the last 72 hours ABG No results found for this basename: PHART, PCO2, PO2, HCO3,  in the last 72 hours  Studies/Results: No results found.  Anti-infectives: Anti-infectives   Start     Dose/Rate Route Frequency Ordered Stop   10/30/12 1800  ceFAZolin (ANCEF) IVPB 2 g/50 mL premix     2 g 100 mL/hr over 30 Minutes Intravenous 3 times per day 10/30/12 1452 10/30/12 1825   10/30/12 1800  metroNIDAZOLE (FLAGYL) IVPB 500 mg     500 mg 100 mL/hr over 60 Minutes Intravenous Every 8 hours 10/30/12 1452 10/30/12 2024   10/30/12 0600  ceFAZolin (ANCEF) IVPB 2 g/50 mL premix     2 g 100 mL/hr over 30 Minutes Intravenous On call to O.R. 10/29/12 1439 10/30/12 0957   10/30/12 0600  metroNIDAZOLE (FLAGYL) IVPB 500 mg     500 mg 100 mL/hr over 60 Minutes Intravenous On call to O.R. 10/29/12 1439 10/30/12 1000      Assessment/Plan: POD 5 lap right colon  Dc home today  [facility] 11/04/2012

## 2012-11-05 ENCOUNTER — Telehealth (INDEPENDENT_AMBULATORY_CARE_PROVIDER_SITE_OTHER): Payer: Self-pay | Admitting: *Deleted

## 2012-11-05 NOTE — Telephone Encounter (Signed)
Patient called to make nurse only appt for the end of this week for staples to be removed per patient so appt made for 7/18.  Patient then states Dr. Dwain Sarna told her to come back to see him in 2 weeks however this RN is unable to find an appt in 2 weeks.  Patient informed of appt this week and then agreeable that we will call her with PO appt with Dwain Sarna MD.

## 2012-11-08 ENCOUNTER — Ambulatory Visit (INDEPENDENT_AMBULATORY_CARE_PROVIDER_SITE_OTHER): Payer: Commercial Managed Care - PPO

## 2012-11-08 DIAGNOSIS — Z4802 Encounter for removal of sutures: Secondary | ICD-10-CM

## 2012-11-08 NOTE — Progress Notes (Signed)
Pt comes in today to staple removal post partial colectomy.  After getting the pt situated on the table comfortably I examined the incision.  The incision looks good, almost completely closed.  I then moistened the incision with saline and cleaned with ChloraPrep.  I proceeded to remove all staples.  I then applied steri-stripes over the incision.  Pt tolerated.

## 2012-11-18 ENCOUNTER — Ambulatory Visit (INDEPENDENT_AMBULATORY_CARE_PROVIDER_SITE_OTHER): Payer: Commercial Managed Care - PPO | Admitting: General Surgery

## 2012-11-18 ENCOUNTER — Encounter (INDEPENDENT_AMBULATORY_CARE_PROVIDER_SITE_OTHER): Payer: Self-pay | Admitting: General Surgery

## 2012-11-18 ENCOUNTER — Encounter (INDEPENDENT_AMBULATORY_CARE_PROVIDER_SITE_OTHER): Payer: Self-pay

## 2012-11-18 VITALS — BP 120/78 | HR 80 | Resp 14 | Ht 68.0 in | Wt 158.2 lb

## 2012-11-18 DIAGNOSIS — Z09 Encounter for follow-up examination after completed treatment for conditions other than malignant neoplasm: Secondary | ICD-10-CM

## 2012-11-18 NOTE — Progress Notes (Signed)
Subjective:     Patient ID: Jocelyn Howell, female   DOB: 1960/06/14, 52 y.o.   MRN: [id number]  HPI This is a 52 year old female who underwent a laparoscopic right colectomy for what ends up being a 5 cm tubular adenoma. Also repaired an umbilical hernia primarily at the same time. She had a midline incision large enough to just remove the mass. She's done well after this. We discussed her pathology again today. She reports no real complaints. She is having some alternating constipation and loose stools. She is eating well. Her energy is getting much better.  Review of Systems     Objective:   Physical Exam Midline incision and port sites all clean without any evidence of infection, healing well    Assessment:     Status post laparoscopic right colectomy for tubular adenoma     Plan:     She can return to work next week. She can return to full activities four weeks postoperatively. I will see her back as needed.

## 2013-04-21 ENCOUNTER — Emergency Department (HOSPITAL_BASED_OUTPATIENT_CLINIC_OR_DEPARTMENT_OTHER): Payer: 59

## 2013-04-21 ENCOUNTER — Encounter (HOSPITAL_BASED_OUTPATIENT_CLINIC_OR_DEPARTMENT_OTHER): Payer: Self-pay | Admitting: Emergency Medicine

## 2013-04-21 ENCOUNTER — Emergency Department (HOSPITAL_BASED_OUTPATIENT_CLINIC_OR_DEPARTMENT_OTHER)
Admission: EM | Admit: 2013-04-21 | Discharge: 2013-04-21 | Disposition: A | Discharge status: Home / Self Care | Payer: 59 | Attending: Emergency Medicine | Admitting: Emergency Medicine

## 2013-04-21 DIAGNOSIS — R011 Cardiac murmur, unspecified: Secondary | ICD-10-CM | POA: Insufficient documentation

## 2013-04-21 DIAGNOSIS — Z8742 Personal history of other diseases of the female genital tract: Secondary | ICD-10-CM | POA: Insufficient documentation

## 2013-04-21 DIAGNOSIS — R209 Unspecified disturbances of skin sensation: Secondary | ICD-10-CM | POA: Insufficient documentation

## 2013-04-21 DIAGNOSIS — M549 Dorsalgia, unspecified: Secondary | ICD-10-CM | POA: Insufficient documentation

## 2013-04-21 DIAGNOSIS — Z9089 Acquired absence of other organs: Secondary | ICD-10-CM | POA: Insufficient documentation

## 2013-04-21 DIAGNOSIS — R1084 Generalized abdominal pain: Secondary | ICD-10-CM | POA: Insufficient documentation

## 2013-04-21 DIAGNOSIS — R141 Gas pain: Secondary | ICD-10-CM | POA: Insufficient documentation

## 2013-04-21 DIAGNOSIS — Z9889 Other specified postprocedural states: Secondary | ICD-10-CM | POA: Insufficient documentation

## 2013-04-21 DIAGNOSIS — K5900 Constipation, unspecified: Secondary | ICD-10-CM | POA: Insufficient documentation

## 2013-04-21 DIAGNOSIS — R109 Unspecified abdominal pain: Secondary | ICD-10-CM

## 2013-04-21 DIAGNOSIS — Z862 Personal history of diseases of the blood and blood-forming organs and certain disorders involving the immune mechanism: Secondary | ICD-10-CM | POA: Insufficient documentation

## 2013-04-21 DIAGNOSIS — R142 Eructation: Secondary | ICD-10-CM | POA: Insufficient documentation

## 2013-04-21 DIAGNOSIS — F988 Other specified behavioral and emotional disorders with onset usually occurring in childhood and adolescence: Secondary | ICD-10-CM | POA: Insufficient documentation

## 2013-04-21 DIAGNOSIS — Z79899 Other long term (current) drug therapy: Secondary | ICD-10-CM | POA: Insufficient documentation

## 2013-04-21 LAB — COMPREHENSIVE METABOLIC PANEL
ALT: 10 U/L (ref 0–35)
AST: 13 U/L (ref 0–37)
Calcium: 8.9 mg/dL (ref 8.4–10.5)
GFR calc Af Amer: >90 mL/min (ref >90)
Sodium: 139 mEq/L (ref 135–145)
Total Protein: 7.5 g/dL (ref 6.0–8.3)

## 2013-04-21 LAB — URINALYSIS, ROUTINE W REFLEX MICROSCOPIC
Glucose, UA: NEGATIVE mg/dL
Hgb urine dipstick: NEGATIVE
Leukocytes, UA: NEGATIVE
Specific Gravity, Urine: 1.02 (ref 1.005–1.030)
Urobilinogen, UA: 0.2 mg/dL (ref 0.0–1.0)

## 2013-04-21 LAB — CBC WITH DIFFERENTIAL/PLATELET
Basophils Absolute: 0 10*3/uL (ref 0.0–0.1)
Eosinophils Absolute: 0.2 10*3/uL (ref 0.0–0.7)
Eosinophils Relative: 7 % — ABNORMAL HIGH (ref 0–5)
MCH: 24.5 pg — ABNORMAL LOW (ref 26.0–34.0)
MCHC: 31.3 g/dL (ref 30.0–36.0)
MCV: 78.4 fL (ref 78.0–100.0)
Platelets: 252 10*3/uL (ref 150–400)
RDW: 17.5 % — ABNORMAL HIGH (ref 11.5–15.5)

## 2013-04-21 MED ORDER — IOHEXOL 300 MG/ML  SOLN
100.0000 mL | Freq: Once | INTRAMUSCULAR | Status: AC | PRN
  Administered 2013-04-21: 100 mL via INTRAVENOUS

## 2013-04-21 MED ORDER — PEG 3350-KCL-NABCB-NACL-NASULF 236 G PO SOLR: 2.0000 L | Freq: Once | ORAL | Status: AC

## 2013-04-21 MED ORDER — SODIUM CHLORIDE 0.9 % IV SOLN
INTRAVENOUS | Status: DC
  Administered 2013-04-21: 09:00:00 via INTRAVENOUS

## 2013-04-21 MED ORDER — IOHEXOL 300 MG/ML  SOLN
50.0000 mL | Freq: Once | INTRAMUSCULAR | Status: AC | PRN
  Administered 2013-04-21: 50 mL via ORAL

## 2013-04-21 NOTE — ED Notes (Signed)
Pt reports abdominal pain, back pain, and c/o "legs feeling numb" since Friday.  LBM Friday.

## 2013-04-21 NOTE — ED Notes (Signed)
Patient transported to CT 

## 2013-04-21 NOTE — ED Provider Notes (Signed)
CSN: [id number]     Arrival date & time 04/21/13  0807 History   First MD Initiated Contact with Patient 04/21/13 [phone]     Chief Complaint  Patient presents with  . Abdominal Pain  . Bloated  . Back Pain   (Consider location/radiation/quality/duration/timing/severity/associated sxs/prior Treatment) HPI Comments: Patient presents to the ER for evaluation of abdominal discomfort. Patient reports that she started noticing the symptoms 3 days ago. She reports bloating, increased gas, generalized abdominal discomfort. She reports that the discomfort goes around into her back and she has been having some numbness in the tops of her legs. There has not been any nausea or vomiting. Patient denies diarrhea, reports that she has not had a bowel movement in 3 days which is very unusual for her. Patient concerned because she had colectomy 6 months ago for benign tumor. Pain is moderate, 6/10, constant. No alleviating or exacerbating factors.  Patient is a 52 y.o. female presenting with abdominal pain and back pain.  Abdominal Pain Associated symptoms: constipation   Back Pain Associated symptoms: abdominal pain     Past Medical History  Diagnosis Date  . Fibroids   . Dysrhythmia     irregular, not for 3 years  . Heart murmur     years ago  . ADD (attention deficit disorder)   . Anemia   . PONV (postoperative nausea and vomiting)    Past Surgical History  Procedure Laterality Date  . Cesarean section  1995  . Endometrial ablation  ~ 2006  . Colon surgery    . Laparoscopic partial colectomy  10/30/2012  . Diagnostic laparoscopy    . Hernia repair  10/30/2012    "q/partial colectomy" (10/30/2012)  . Laparoscopic partial colectomy Right 10/30/2012    Procedure: LAPAROSCOPIC PARTIAL COLECTOMY;  Surgeon: Emelia Loron, MD;  Location: [facility] OR;  Service: General;  Laterality: Right;  removal right colon  . Appendectomy     No family history on file. History  Substance Use Topics  . Smoking  status: Never Smoker   . Smokeless tobacco: Never Used  . Alcohol Use: Yes     Comment: 10/30/2012 "might have a drink q 3-4 months"   OB History   Grav Para Term Preterm Abortions TAB SAB Ect Mult Living                 Review of Systems  Gastrointestinal: Positive for abdominal pain and constipation.  Musculoskeletal: Positive for back pain.  All other systems reviewed and are negative.    Allergies  Review of patient's allergies indicates no known allergies.  Home Medications   Current Outpatient Rx  Name  Route  Sig  Dispense  Refill  . amphetamine-dextroamphetamine (ADDERALL) 20 MG tablet   Oral   Take 20 mg by mouth daily.         Marland Kitchen docusate sodium 100 MG CAPS   Oral   Take 100 mg by mouth 2 (two) times daily.   30 capsule   2   . oxyCODONE-acetaminophen (PERCOCET/ROXICET) 5-325 MG per tablet   Oral   Take 1-2 tablets by mouth every 4 (four) hours as needed.   30 tablet   0   . Vitamin D, Ergocalciferol, (DRISDOL) 50000 UNITS CAPS   Oral   Take 50,000 Units by mouth every 7 (seven) days.          BP 152/76  Pulse 76  Temp(Src) 98.3 F (36.8 C) (Oral)  Resp 16  Ht 5\' 8"  (1.727  m)  Wt 157 lb (71.215 kg)  BMI 23.88 kg/m2  SpO2 100%  LMP 04/14/2013 Physical Exam  Constitutional: She is oriented to person, place, and time. She appears well-developed and well-nourished. No distress.  HENT:  Head: Normocephalic and atraumatic.  Right Ear: Hearing normal.  Left Ear: Hearing normal.  Nose: Nose normal.  Mouth/Throat: Oropharynx is clear and moist and mucous membranes are normal.  Eyes: Conjunctivae and EOM are normal. Pupils are equal, round, and reactive to light.  Neck: Normal range of motion. Neck supple.  Cardiovascular: Regular rhythm, S1 normal and S2 normal.  Exam reveals no gallop and no friction rub.   No murmur heard. Pulmonary/Chest: Effort normal and breath sounds normal. No respiratory distress. She exhibits no tenderness.  Abdominal:  Soft. Normal appearance and bowel sounds are normal. There is no hepatosplenomegaly. There is generalized tenderness. There is no rebound, no guarding, no tenderness at McBurney's point and negative Murphy's sign. No hernia.  Tenderness is very mild and diffuse. No point tenderness at McBurney's. Negative Murphy sign.  Musculoskeletal: Normal range of motion.  Neurological: She is alert and oriented to person, place, and time. She has normal strength. No cranial nerve deficit or sensory deficit. Coordination normal. GCS eye subscore is 4. GCS verbal subscore is 5. GCS motor subscore is 6.  Skin: Skin is warm, dry and intact. No rash noted. No cyanosis.  Psychiatric: She has a normal mood and affect. Her speech is normal and behavior is normal. Thought content normal.    ED Course  Procedures (including critical care time) Labs Review Labs Reviewed  CBC WITH DIFFERENTIAL - Abnormal; Notable for the following:    WBC 3.4 (*)    Hemoglobin 10.1 (*)    HCT 32.3 (*)    MCH 24.5 (*)    RDW 17.5 (*)    Neutrophils Relative % 41 (*)    Neutro Abs 1.4 (*)    Eosinophils Relative 7 (*)    All other components within normal limits  COMPREHENSIVE METABOLIC PANEL - Abnormal; Notable for the following:    GFR calc non Af Amer 84 (*)    All other components within normal limits  LIPASE, BLOOD  URINALYSIS, ROUTINE W REFLEX MICROSCOPIC   Imaging Review Ct Abdomen Pelvis W Contrast  04/21/2013   CLINICAL DATA:  Lower abdominal pain left greater than right. Prior partial colectomy and appendectomy.  EXAM: CT ABDOMEN AND PELVIS WITH CONTRAST  TECHNIQUE: Multidetector CT imaging of the abdomen and pelvis was performed using the standard protocol following bolus administration of intravenous contrast.  CONTRAST:  100 mL OMNIPAQUE IOHEXOL 300 MG/ML  SOLN,  COMPARISON:  07/16/2012  FINDINGS: Lung bases demonstrate a 2 mm nodule over the left lower lobe not well seen on the prior exam as this area of the left  lower lobe lobe likely was not imaged on the prior exam.  Abdominal images demonstrate a sub cm hypodensity over the dome of the right lobe unchanged likely a cyst. There is an a normal pancreas, spleen, gallbladder and adrenal glands. There are a couple small bilateral renal cysts with the larger simple in nature measuring 2 cm over the lower pole of the right kidney in the smaller over the upper pole of the left kidney too small to characterize but likely simple in nature. No definite hydronephrosis or nephrolithiasis. No perinephric inflammation or fluid is present. Ureters are within normal. There is moderate fecal retention throughout the colon. Small bowel is unremarkable.  Pelvic  images demonstrate an enlarged uterus with multiple calcified and noncalcified fibroids unchanged.  IMPRESSION: No acute findings in the abdomen/pelvis.  Enlarged leiomyomatous uterus unchanged.  2 cm simple right renal cyst and subcentimeter left renal hypodensity likely a cyst but too small to characterize. Recommend followup CT in 6 months.  Sub cm liver hypodensity unchanged likely a cyst.  2 mm nodule left lower lobe. Recommend follow-up noncontrast chest CT 1 year.  This recommendation follows the consensus statement: Guidelines for Management of Small Pulmonary Nodules Detected on CT Scans: A Statement from the Fleischner Society as published in Radiology 2005; 237:395-400. Online at: DietDisorder.cz.   Electronically Signed   By: Elberta Fortis M.D.   On: 04/21/2013 09:29    EKG Interpretation   None       MDM   1. Abdominal pain    Presents to the ER for evaluation of several days of abdominal pain, bloating, increased gas. Patient concerned because of her surgery 6 months ago. Examination did reveal some diffuse tenderness without any guarding or rebound. Blood work was normal. CAT scan was performed because of the history of surgery and no acute abnormalities are seen  other than a lot of increased stool which likely explains the patient's current symptoms. She will try MiraLax, magnesium citrate, over-the-counter measures and if not improved, will fill the prescription for GoLYTELY prep.    Gilda Crease, MD 04/21/13 [phone]

## 2013-09-12 DIAGNOSIS — R41840 Attention and concentration deficit: Secondary | ICD-10-CM | POA: Insufficient documentation

## 2013-09-12 DIAGNOSIS — Z0000 Encounter for general adult medical examination without abnormal findings: Secondary | ICD-10-CM | POA: Insufficient documentation

## 2013-09-12 DIAGNOSIS — B001 Herpesviral vesicular dermatitis: Secondary | ICD-10-CM | POA: Insufficient documentation

## 2013-10-17 ENCOUNTER — Other Ambulatory Visit: Payer: Self-pay | Admitting: Gastroenterology

## 2013-10-22 DIAGNOSIS — K635 Polyp of colon: Secondary | ICD-10-CM | POA: Insufficient documentation

## 2013-11-25 ENCOUNTER — Other Ambulatory Visit: Payer: Self-pay

## 2013-11-25 DIAGNOSIS — Z1231 Encounter for screening mammogram for malignant neoplasm of breast: Secondary | ICD-10-CM

## 2013-12-04 ENCOUNTER — Ambulatory Visit
Admission: RE | Admit: 2013-12-04 | Discharge: 2013-12-04 | Disposition: A | Discharge status: Home / Self Care | Payer: 59 | Source: Ambulatory Visit

## 2013-12-04 DIAGNOSIS — D72819 Decreased white blood cell count, unspecified: Secondary | ICD-10-CM | POA: Insufficient documentation

## 2013-12-04 DIAGNOSIS — D649 Anemia, unspecified: Secondary | ICD-10-CM | POA: Insufficient documentation

## 2013-12-04 DIAGNOSIS — Z1231 Encounter for screening mammogram for malignant neoplasm of breast: Secondary | ICD-10-CM

## 2013-12-04 DIAGNOSIS — R202 Paresthesia of skin: Secondary | ICD-10-CM | POA: Insufficient documentation

## 2013-12-04 DIAGNOSIS — R911 Solitary pulmonary nodule: Secondary | ICD-10-CM | POA: Insufficient documentation

## 2013-12-05 ENCOUNTER — Other Ambulatory Visit: Payer: Self-pay | Admitting: Obstetrics and Gynecology

## 2013-12-05 DIAGNOSIS — R928 Other abnormal and inconclusive findings on diagnostic imaging of breast: Secondary | ICD-10-CM

## 2013-12-11 ENCOUNTER — Ambulatory Visit
Admission: RE | Admit: 2013-12-11 | Discharge: 2013-12-11 | Disposition: A | Discharge status: Home / Self Care | Payer: 59 | Source: Ambulatory Visit | Attending: Obstetrics and Gynecology | Admitting: Obstetrics and Gynecology

## 2013-12-11 DIAGNOSIS — R928 Other abnormal and inconclusive findings on diagnostic imaging of breast: Secondary | ICD-10-CM

## 2014-06-25 ENCOUNTER — Other Ambulatory Visit (HOSPITAL_BASED_OUTPATIENT_CLINIC_OR_DEPARTMENT_OTHER): Payer: Self-pay | Admitting: Family Medicine

## 2014-06-25 DIAGNOSIS — R1912 Hyperactive bowel sounds: Secondary | ICD-10-CM

## 2014-06-25 DIAGNOSIS — R1011 Right upper quadrant pain: Secondary | ICD-10-CM

## 2014-06-25 DIAGNOSIS — R110 Nausea: Secondary | ICD-10-CM

## 2014-06-27 ENCOUNTER — Ambulatory Visit (HOSPITAL_BASED_OUTPATIENT_CLINIC_OR_DEPARTMENT_OTHER)
Admission: RE | Admit: 2014-06-27 | Discharge: 2014-06-27 | Disposition: A | Discharge status: Home / Self Care | Payer: 59 | Source: Ambulatory Visit | Attending: Family Medicine | Admitting: Family Medicine

## 2014-06-27 DIAGNOSIS — R1912 Hyperactive bowel sounds: Secondary | ICD-10-CM

## 2014-06-27 DIAGNOSIS — R1011 Right upper quadrant pain: Secondary | ICD-10-CM

## 2014-06-27 DIAGNOSIS — R110 Nausea: Secondary | ICD-10-CM | POA: Insufficient documentation

## 2014-06-27 DIAGNOSIS — R109 Unspecified abdominal pain: Secondary | ICD-10-CM | POA: Diagnosis present

## 2014-06-27 MED ORDER — IOHEXOL 300 MG/ML  SOLN
100.0000 mL | Freq: Once | INTRAMUSCULAR | Status: AC | PRN
  Administered 2014-06-27: 100 mL via INTRAVENOUS

## 2014-09-15 ENCOUNTER — Encounter (HOSPITAL_BASED_OUTPATIENT_CLINIC_OR_DEPARTMENT_OTHER): Payer: Self-pay

## 2014-09-15 ENCOUNTER — Ambulatory Visit (HOSPITAL_BASED_OUTPATIENT_CLINIC_OR_DEPARTMENT_OTHER)
Admission: RE | Admit: 2014-09-15 | Discharge: 2014-09-15 | Disposition: A | Discharge status: Home / Self Care | Payer: 59 | Source: Ambulatory Visit | Attending: Family Medicine | Admitting: Family Medicine

## 2014-09-15 ENCOUNTER — Other Ambulatory Visit (HOSPITAL_BASED_OUTPATIENT_CLINIC_OR_DEPARTMENT_OTHER): Payer: Self-pay | Admitting: Family Medicine

## 2014-09-15 DIAGNOSIS — R0602 Shortness of breath: Secondary | ICD-10-CM | POA: Diagnosis not present

## 2014-09-15 DIAGNOSIS — M79604 Pain in right leg: Secondary | ICD-10-CM | POA: Insufficient documentation

## 2014-09-15 MED ORDER — IOHEXOL 350 MG/ML SOLN: 100.0000 mL | Freq: Once | INTRAVENOUS | Status: DC | PRN

## 2014-09-15 MED ORDER — IOHEXOL 350 MG/ML SOLN
100.0000 mL | Freq: Once | INTRAVENOUS | Status: AC | PRN
  Administered 2014-09-15: 100 mL via INTRAVENOUS

## 2015-02-19 ENCOUNTER — Encounter: Payer: Self-pay | Admitting: Podiatry

## 2015-02-19 ENCOUNTER — Ambulatory Visit (INDEPENDENT_AMBULATORY_CARE_PROVIDER_SITE_OTHER): Payer: 59 | Admitting: Podiatry

## 2015-02-19 VITALS — BP 125/83 | HR 82

## 2015-02-19 DIAGNOSIS — M205X9 Other deformities of toe(s) (acquired), unspecified foot: Secondary | ICD-10-CM

## 2015-02-19 DIAGNOSIS — B351 Tinea unguium: Secondary | ICD-10-CM

## 2015-02-19 DIAGNOSIS — M79606 Pain in leg, unspecified: Secondary | ICD-10-CM | POA: Insufficient documentation

## 2015-02-19 DIAGNOSIS — M21619 Bunion of unspecified foot: Secondary | ICD-10-CM | POA: Diagnosis not present

## 2015-02-19 DIAGNOSIS — M2020 Hallux rigidus, unspecified foot: Secondary | ICD-10-CM | POA: Insufficient documentation

## 2015-02-19 NOTE — Progress Notes (Signed)
SUBJECTIVE: 54 y.o. year old female presents with painful nails and pain under the big joint.  Stated that she treated her nails with Jublia but failed to see any improvement. The problem has been going on for over a year.   REVIEW OF SYSTEMS: Constitutional: negative for chills, fatigue, fevers, malaise and night sweats Eyes: In need of bifocal lenses. Ears, nose, mouth, throat, and face: negative Respiratory: negative Cardiovascular: negative Gastrointestinal: negative Genitourinary:negative Integument/breast: negative Hematologic/lymphatic: negative Musculoskeletal:negative Neurological: negative Endocrine: negative Allergic/Immunologic: negative    OBJECTIVE: DERMATOLOGIC EXAMINATION: Nails: Thick deformed and discolored nail with yellow debris under nail plate L1>R1. Pain at the nail base while in closed in shoes. Positive of hyperpigmentation at proximal skin folder.  VASCULAR EXAMINATION OF LOWER LIMBS: Pedal pulses: All pedal pulses are palpable with normal pulsation.  No edema or erythema noted.  NEUROLOGIC EXAMINATION OF THE LOWER LIMBS: All epicritic and tactile sensations grossly intact.   MUSCULOSKELETAL EXAMINATION: Excess first ray motion in sagittal plane 1st ray bilateral L>R upon loading of forefoot, symptomatic under the first MPJ left with weight bearing. Bunion deformity bilateral. Limited dorsiflexion of the first MPJ motion with forefoot loading. Normal range of motion available in forefoot and rearfoot and no pain with motion.  Normal muscle strength lower limbs bilateral.   ASSESSMENT: Onychomycosis both great toe nails with pain. Hallux limitus functional bilateral. 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   PLAN: Reviewed clinical findings and available treatment options. Both great toe nail debrided. May continue with Jublia treatment. May benefit from custom orthotics to address hallux limitus problem that is causing  retrograde force against the nail plate. Also use open toed shoes if possible.

## 2015-02-19 NOTE — Patient Instructions (Signed)
Seen for painful nails. Noted of excess motion at the metatarsal joint.  All nails debrided. Will benefit from custom orthotics.

## 2015-04-06 ENCOUNTER — Other Ambulatory Visit: Payer: Self-pay

## 2015-04-06 DIAGNOSIS — Z1231 Encounter for screening mammogram for malignant neoplasm of breast: Secondary | ICD-10-CM

## 2015-04-27 DIAGNOSIS — E039 Hypothyroidism, unspecified: Secondary | ICD-10-CM | POA: Diagnosis not present

## 2015-04-27 DIAGNOSIS — H6502 Acute serous otitis media, left ear: Secondary | ICD-10-CM | POA: Diagnosis not present

## 2015-04-27 DIAGNOSIS — E559 Vitamin D deficiency, unspecified: Secondary | ICD-10-CM | POA: Diagnosis not present

## 2015-04-27 DIAGNOSIS — R1011 Right upper quadrant pain: Secondary | ICD-10-CM | POA: Diagnosis not present

## 2015-04-27 DIAGNOSIS — L659 Nonscarring hair loss, unspecified: Secondary | ICD-10-CM | POA: Diagnosis not present

## 2015-05-03 ENCOUNTER — Other Ambulatory Visit (HOSPITAL_BASED_OUTPATIENT_CLINIC_OR_DEPARTMENT_OTHER): Payer: Self-pay | Admitting: Family Medicine

## 2015-05-03 DIAGNOSIS — R1011 Right upper quadrant pain: Secondary | ICD-10-CM

## 2015-05-03 DIAGNOSIS — Z1231 Encounter for screening mammogram for malignant neoplasm of breast: Secondary | ICD-10-CM

## 2015-05-04 ENCOUNTER — Ambulatory Visit (HOSPITAL_BASED_OUTPATIENT_CLINIC_OR_DEPARTMENT_OTHER)
Admission: RE | Admit: 2015-05-04 | Discharge: 2015-05-04 | Disposition: A | Discharge status: Home / Self Care | Payer: 59 | Source: Ambulatory Visit | Attending: Family Medicine | Admitting: Family Medicine

## 2015-05-04 DIAGNOSIS — Z9049 Acquired absence of other specified parts of digestive tract: Secondary | ICD-10-CM | POA: Diagnosis not present

## 2015-05-04 DIAGNOSIS — N281 Cyst of kidney, acquired: Secondary | ICD-10-CM | POA: Diagnosis not present

## 2015-05-04 DIAGNOSIS — Z1231 Encounter for screening mammogram for malignant neoplasm of breast: Secondary | ICD-10-CM | POA: Diagnosis not present

## 2015-05-04 DIAGNOSIS — R102 Pelvic and perineal pain: Secondary | ICD-10-CM | POA: Diagnosis not present

## 2015-05-04 DIAGNOSIS — R1011 Right upper quadrant pain: Secondary | ICD-10-CM | POA: Diagnosis not present

## 2015-05-04 DIAGNOSIS — E559 Vitamin D deficiency, unspecified: Secondary | ICD-10-CM | POA: Diagnosis not present

## 2015-05-04 DIAGNOSIS — R1033 Periumbilical pain: Secondary | ICD-10-CM | POA: Diagnosis not present

## 2015-05-04 DIAGNOSIS — E039 Hypothyroidism, unspecified: Secondary | ICD-10-CM | POA: Diagnosis not present

## 2015-05-10 ENCOUNTER — Ambulatory Visit: Payer: 59

## 2015-05-25 ENCOUNTER — Ambulatory Visit: Payer: 59

## 2015-06-10 DIAGNOSIS — H524 Presbyopia: Secondary | ICD-10-CM | POA: Diagnosis not present

## 2015-07-08 MED FILL — valACYclovir HCL 1 GM TABS: 1 | 1 days supply | Qty: 4 | Fill #0

## 2015-07-08 MED FILL — TRETINOIN 0.1% CREAM: 0.1 | 90 days supply | Qty: 45 | Fill #1

## 2015-07-08 MED FILL — VIT D2 1.25 MG (50,000 UNIT: 1.25 MG | 84 days supply | Qty: 12 | Fill #0

## 2015-07-12 MED FILL — VALACYCLOVIR HCL 500 MG TAB: 500 | 30 days supply | Qty: 60 | Fill #0

## 2015-07-14 MED FILL — AMPHETAMINE SALTS 20 MG TAB: 20 | 30 days supply | Qty: 60 | Fill #0

## 2015-07-23 MED FILL — IBUPROFEN 600 MG TABLET: 600 | 7 days supply | Qty: 30 | Fill #0

## 2015-07-23 MED FILL — HYDROCODON-APAP 5-325: 5-325 | 5 days supply | Qty: 20 | Fill #0

## 2015-07-23 MED FILL — CHLORHEXIDINE 0.12% RINSE: 0.12 | 15 days supply | Qty: 473 | Fill #0

## 2015-07-23 MED FILL — PENICILLIN VK 500 MG TABLET: 500 | 10 days supply | Qty: 40 | Fill #0

## 2015-08-23 DIAGNOSIS — R300 Dysuria: Secondary | ICD-10-CM | POA: Diagnosis not present

## 2015-08-23 DIAGNOSIS — R350 Frequency of micturition: Secondary | ICD-10-CM | POA: Diagnosis not present

## 2015-08-23 MED FILL — CIPROFLOXACIN HCL 500 MG TA: 500 | 7 days supply | Qty: 14 | Fill #0

## 2015-09-14 DIAGNOSIS — R102 Pelvic and perineal pain: Secondary | ICD-10-CM | POA: Diagnosis not present

## 2015-09-14 DIAGNOSIS — R1030 Lower abdominal pain, unspecified: Secondary | ICD-10-CM | POA: Diagnosis not present

## 2015-09-14 DIAGNOSIS — R41840 Attention and concentration deficit: Secondary | ICD-10-CM | POA: Diagnosis not present

## 2015-09-14 MED FILL — DEXTROAMP-AMPHETAMIN 20 MG: 20 | 30 days supply | Qty: 60 | Fill #0

## 2015-10-19 DIAGNOSIS — R41840 Attention and concentration deficit: Secondary | ICD-10-CM | POA: Diagnosis not present

## 2015-10-19 DIAGNOSIS — R1013 Epigastric pain: Secondary | ICD-10-CM | POA: Diagnosis not present

## 2015-10-19 DIAGNOSIS — E039 Hypothyroidism, unspecified: Secondary | ICD-10-CM | POA: Diagnosis not present

## 2015-10-21 DIAGNOSIS — R1013 Epigastric pain: Secondary | ICD-10-CM | POA: Diagnosis not present

## 2015-10-21 DIAGNOSIS — E039 Hypothyroidism, unspecified: Secondary | ICD-10-CM | POA: Diagnosis not present

## 2015-11-02 DIAGNOSIS — R1011 Right upper quadrant pain: Secondary | ICD-10-CM | POA: Diagnosis not present

## 2015-11-02 DIAGNOSIS — R140 Abdominal distension (gaseous): Secondary | ICD-10-CM | POA: Diagnosis not present

## 2015-11-17 DIAGNOSIS — D251 Intramural leiomyoma of uterus: Secondary | ICD-10-CM | POA: Diagnosis not present

## 2015-11-19 ENCOUNTER — Other Ambulatory Visit (HOSPITAL_BASED_OUTPATIENT_CLINIC_OR_DEPARTMENT_OTHER): Payer: Self-pay | Admitting: Physician Assistant

## 2015-11-19 DIAGNOSIS — R229 Localized swelling, mass and lump, unspecified: Principal | ICD-10-CM

## 2015-11-19 DIAGNOSIS — IMO0002 Reserved for concepts with insufficient information to code with codable children: Secondary | ICD-10-CM

## 2015-11-19 DIAGNOSIS — R2242 Localized swelling, mass and lump, left lower limb: Secondary | ICD-10-CM | POA: Diagnosis not present

## 2015-11-19 MED FILL — DEXTROAMP-AMPHETAMIN 20 MG: 20 | 30 days supply | Qty: 60 | Fill #0

## 2015-11-22 MED FILL — FLUTICASONE PROP 50 MCG SPR: 50 | 30 days supply | Qty: 16 | Fill #0

## 2015-11-23 MED FILL — DENTA 5000 PLUS CREAM: 1.1 | 30 days supply | Qty: 51 | Fill #0

## 2015-11-25 ENCOUNTER — Other Ambulatory Visit (HOSPITAL_BASED_OUTPATIENT_CLINIC_OR_DEPARTMENT_OTHER): Payer: Self-pay | Admitting: Obstetrics and Gynecology

## 2015-11-25 DIAGNOSIS — D251 Intramural leiomyoma of uterus: Secondary | ICD-10-CM

## 2015-11-26 ENCOUNTER — Ambulatory Visit (HOSPITAL_BASED_OUTPATIENT_CLINIC_OR_DEPARTMENT_OTHER)
Admission: RE | Admit: 2015-11-26 | Discharge: 2015-11-26 | Disposition: A | Discharge status: Home / Self Care | Payer: 59 | Source: Ambulatory Visit | Attending: Obstetrics and Gynecology | Admitting: Obstetrics and Gynecology

## 2015-11-26 ENCOUNTER — Ambulatory Visit (HOSPITAL_BASED_OUTPATIENT_CLINIC_OR_DEPARTMENT_OTHER)
Admission: RE | Admit: 2015-11-26 | Discharge: 2015-11-26 | Disposition: A | Discharge status: Home / Self Care | Payer: 59 | Source: Ambulatory Visit | Attending: Physician Assistant | Admitting: Physician Assistant

## 2015-11-26 DIAGNOSIS — D251 Intramural leiomyoma of uterus: Secondary | ICD-10-CM

## 2015-11-26 DIAGNOSIS — D1724 Benign lipomatous neoplasm of skin and subcutaneous tissue of left leg: Secondary | ICD-10-CM | POA: Diagnosis not present

## 2015-11-26 DIAGNOSIS — R2242 Localized swelling, mass and lump, left lower limb: Secondary | ICD-10-CM | POA: Diagnosis not present

## 2015-11-26 DIAGNOSIS — R229 Localized swelling, mass and lump, unspecified: Principal | ICD-10-CM

## 2015-11-26 DIAGNOSIS — D259 Leiomyoma of uterus, unspecified: Secondary | ICD-10-CM | POA: Diagnosis not present

## 2015-11-26 DIAGNOSIS — IMO0002 Reserved for concepts with insufficient information to code with codable children: Secondary | ICD-10-CM

## 2015-11-26 DIAGNOSIS — D252 Subserosal leiomyoma of uterus: Secondary | ICD-10-CM | POA: Insufficient documentation

## 2015-12-06 MED FILL — AMOXICILLIN 875 MG TABLET: 875 | 5 days supply | Qty: 10 | Fill #0

## 2015-12-10 MED FILL — AMOXICILLIN 875 MG TABLET: 875 | 7 days supply | Qty: 14 | Fill #0

## 2016-01-20 DIAGNOSIS — K5900 Constipation, unspecified: Secondary | ICD-10-CM | POA: Diagnosis not present

## 2016-01-20 DIAGNOSIS — M545 Low back pain: Secondary | ICD-10-CM | POA: Diagnosis not present

## 2016-01-20 DIAGNOSIS — R1084 Generalized abdominal pain: Secondary | ICD-10-CM | POA: Diagnosis not present

## 2016-01-20 DIAGNOSIS — R41840 Attention and concentration deficit: Secondary | ICD-10-CM | POA: Diagnosis not present

## 2016-01-20 MED FILL — DEXTROAMP-AMPHETAMIN 20 MG: 20 | 30 days supply | Qty: 60 | Fill #0

## 2016-01-20 MED FILL — GAVILYTE-N SOLUTION: 420 | 1 days supply | Qty: 4000 | Fill #0

## 2016-01-20 MED FILL — CYCLOBENZAPRINE 10 MG TAB: 10 | 10 days supply | Qty: 30 | Fill #0

## 2016-01-21 DIAGNOSIS — R140 Abdominal distension (gaseous): Secondary | ICD-10-CM | POA: Diagnosis not present

## 2016-01-21 DIAGNOSIS — R1084 Generalized abdominal pain: Secondary | ICD-10-CM | POA: Diagnosis not present

## 2016-01-21 DIAGNOSIS — D259 Leiomyoma of uterus, unspecified: Secondary | ICD-10-CM | POA: Diagnosis not present

## 2016-01-21 DIAGNOSIS — M545 Low back pain: Secondary | ICD-10-CM | POA: Diagnosis not present

## 2016-01-21 DIAGNOSIS — K567 Ileus, unspecified: Secondary | ICD-10-CM | POA: Diagnosis not present

## 2016-01-21 DIAGNOSIS — D649 Anemia, unspecified: Secondary | ICD-10-CM | POA: Diagnosis not present

## 2016-01-21 DIAGNOSIS — R110 Nausea: Secondary | ICD-10-CM | POA: Diagnosis not present

## 2016-01-21 DIAGNOSIS — R1912 Hyperactive bowel sounds: Secondary | ICD-10-CM | POA: Diagnosis not present

## 2016-01-21 DIAGNOSIS — F909 Attention-deficit hyperactivity disorder, unspecified type: Secondary | ICD-10-CM | POA: Diagnosis not present

## 2016-01-21 DIAGNOSIS — R109 Unspecified abdominal pain: Secondary | ICD-10-CM | POA: Diagnosis not present

## 2016-01-21 DIAGNOSIS — K5900 Constipation, unspecified: Secondary | ICD-10-CM | POA: Diagnosis not present

## 2016-01-22 DIAGNOSIS — K567 Ileus, unspecified: Secondary | ICD-10-CM | POA: Diagnosis not present

## 2016-01-22 DIAGNOSIS — D649 Anemia, unspecified: Secondary | ICD-10-CM | POA: Diagnosis not present

## 2016-01-22 DIAGNOSIS — D259 Leiomyoma of uterus, unspecified: Secondary | ICD-10-CM | POA: Diagnosis not present

## 2016-01-22 DIAGNOSIS — M545 Low back pain: Secondary | ICD-10-CM | POA: Diagnosis not present

## 2016-01-22 DIAGNOSIS — R140 Abdominal distension (gaseous): Secondary | ICD-10-CM | POA: Diagnosis not present

## 2016-01-22 DIAGNOSIS — R109 Unspecified abdominal pain: Secondary | ICD-10-CM | POA: Diagnosis not present

## 2016-01-22 DIAGNOSIS — R1912 Hyperactive bowel sounds: Secondary | ICD-10-CM | POA: Diagnosis not present

## 2016-01-22 DIAGNOSIS — F909 Attention-deficit hyperactivity disorder, unspecified type: Secondary | ICD-10-CM | POA: Diagnosis not present

## 2016-01-22 DIAGNOSIS — K5900 Constipation, unspecified: Secondary | ICD-10-CM | POA: Diagnosis not present

## 2016-01-23 DIAGNOSIS — F909 Attention-deficit hyperactivity disorder, unspecified type: Secondary | ICD-10-CM | POA: Diagnosis not present

## 2016-01-23 DIAGNOSIS — K5900 Constipation, unspecified: Secondary | ICD-10-CM | POA: Diagnosis not present

## 2016-01-23 DIAGNOSIS — R1912 Hyperactive bowel sounds: Secondary | ICD-10-CM | POA: Diagnosis not present

## 2016-01-23 DIAGNOSIS — K567 Ileus, unspecified: Secondary | ICD-10-CM | POA: Diagnosis not present

## 2016-01-23 DIAGNOSIS — D259 Leiomyoma of uterus, unspecified: Secondary | ICD-10-CM | POA: Diagnosis not present

## 2016-01-23 DIAGNOSIS — M545 Low back pain: Secondary | ICD-10-CM | POA: Diagnosis not present

## 2016-01-23 DIAGNOSIS — R109 Unspecified abdominal pain: Secondary | ICD-10-CM | POA: Diagnosis not present

## 2016-01-23 DIAGNOSIS — R140 Abdominal distension (gaseous): Secondary | ICD-10-CM | POA: Diagnosis not present

## 2016-01-23 DIAGNOSIS — D649 Anemia, unspecified: Secondary | ICD-10-CM | POA: Diagnosis not present

## 2016-01-24 DIAGNOSIS — R1030 Lower abdominal pain, unspecified: Secondary | ICD-10-CM | POA: Diagnosis not present

## 2016-01-24 DIAGNOSIS — M545 Low back pain: Secondary | ICD-10-CM | POA: Diagnosis not present

## 2016-01-24 MED FILL — KETOROLAC 10 MG TABLET: 10 | 5 days supply | Qty: 20 | Fill #0

## 2016-01-25 DIAGNOSIS — R194 Change in bowel habit: Secondary | ICD-10-CM | POA: Diagnosis not present

## 2016-01-25 DIAGNOSIS — Z86010 Personal history of colonic polyps: Secondary | ICD-10-CM | POA: Diagnosis not present

## 2016-01-25 DIAGNOSIS — Z1211 Encounter for screening for malignant neoplasm of colon: Secondary | ICD-10-CM | POA: Diagnosis not present

## 2016-01-28 ENCOUNTER — Ambulatory Visit: Payer: 59 | Attending: Family Medicine | Admitting: Physical Therapy

## 2016-01-28 DIAGNOSIS — M5442 Lumbago with sciatica, left side: Secondary | ICD-10-CM | POA: Diagnosis not present

## 2016-01-28 DIAGNOSIS — M5441 Lumbago with sciatica, right side: Secondary | ICD-10-CM | POA: Diagnosis not present

## 2016-01-28 NOTE — Therapy (Signed)
[facility] [address]  [place], [place], [address] Phone: [phone]   Fax:  [phone]  Physical Therapy Evaluation  Patient Details  Name: Jocelyn Howell MRN: [id number] Date of Birth: August 18, 1960 Referring Provider: Vedia Coffer, [facility]  Encounter Date: 01/28/2016      PT End of Session - 01/28/16 1019    Visit Number 1   Number of Visits 8   Date for PT Re-Evaluation 02/25/16   PT Start Time 0932   PT Stop Time 1019   PT Time Calculation (min) 47 min   Activity Tolerance Patient tolerated treatment well   Behavior During Therapy [facility] for tasks assessed/performed      Past Medical History:  Diagnosis Date  . ADD (attention deficit disorder)   . Anemia   . Dysrhythmia    irregular, not for 3 years  . Fibroids   . Heart murmur    years ago  . PONV (postoperative nausea and vomiting)     Past Surgical History:  Procedure Laterality Date  . APPENDECTOMY    . CESAREAN SECTION  1995  . COLON SURGERY    . DIAGNOSTIC LAPAROSCOPY    . ENDOMETRIAL ABLATION  ~ 2006  . HERNIA REPAIR  10/30/2012   "q/partial colectomy" (10/30/2012)  . LAPAROSCOPIC PARTIAL COLECTOMY  10/30/2012  . LAPAROSCOPIC PARTIAL COLECTOMY Right 10/30/2012   Procedure: LAPAROSCOPIC PARTIAL COLECTOMY;  Surgeon: Rolm Bookbinder, MD;  Location: [facility];  Service: General;  Laterality: Right;  removal right colon    There were no vitals filed for this visit.       Subjective Assessment - 01/28/16 0934    Subjective Around 8 days was fully active and exercising. Was working out and attempted 50# resisted deep squat. No pain at the time, but 2 days woke up in severe pain. Pain progressed to point where she could not walk. Developed an ileus and was hospitalized for this last weekend.   Limitations Sitting;Standing   How long can you sit comfortably? 30 minutes   How long can you stand comfortably? up to 1 hr   How long can you walk  comfortably? not comfortable trying right now   Diagnostic tests 01/21/16 CT Abdomen - Multiple uterine fibroids. Musculoskeletal: No acute osseous abnormality no aggressive lytic or sclerotic osseous lesion.   Patient Stated Goals "To get back to baseline and my pre-therapy routine"   Currently in Pain? Yes   Pain Score 3    Pain Location Back   Pain Orientation Lower;Left   Pain Descriptors / Indicators Contraction;Squeezing;Throbbing   Pain Type Acute pain   Pain Radiating Towards intermittent numbness & tingling down B LE's to feet   Pain Onset 1 to 4 weeks ago   Pain Frequency Constant   Aggravating Factors  standing up w/o UE assist, bending, twisting   Pain Relieving Factors Motrin   Effect of Pain on Daily Activities Not able to go to the gym            [facility] PT Assessment - 01/28/16 0932      Assessment   Medical Diagnosis Acute LBP with B sciatica   Referring Provider Vedia Coffer, [facility]   Onset Date/Surgical Date 01/19/16   Next MD Visit none scheduled   Prior Therapy none     Balance Screen   Has the patient fallen in the past 6 months No   Has the patient had a decrease in activity level because of a  fear of falling?  No   Is the patient reluctant to leave their home because of a fear of falling?  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, standing and walking - typically sitting for 3-4 hrs while documenting   Leisure Working out, tennis, walking, hiking     Observation/Other Assessments   Focus on Therapeutic Outcomes (FOTO)  Lumbar spine - 56% (44% limitation); predicted 755 (25% limitation)     ROM / Strength   AROM / PROM / Strength AROM;Strength     AROM   AROM Assessment Site Lumbar   Lumbar Flexion hands to knees (sliding hands down thighs d/t increased pain)   Lumbar Extension 75% (mild pain/discomfort)   Lumbar - Right Side Bend hand to  lateral knee (mild discomfort)   Lumbar - Left Side Bend hand to lateral knee (mild discomfort)   Lumbar - Right Rotation 75% (mild discomfort)   Lumbar - Left Rotation [facility]     Strength   Strength Assessment Site Hip   Right/Left Hip Right;Left   Right Hip Flexion 4/5   Right Hip Extension 3+/5   Right Hip External Rotation  4/5   Right Hip Internal Rotation 4-/5   Right Hip ABduction 4/5   Right Hip ADduction 4-/5   Left Hip Flexion 4/5   Left Hip Extension 3/5   Left Hip External Rotation 4-/5   Left Hip Internal Rotation 3+/5   Left Hip ABduction 4-/5   Left Hip ADduction 3+/5     Flexibility   Soft Tissue Assessment /Muscle Length yes   Hamstrings mildly tight vs increased tension/guarding B   Quadriceps moderately tight hip flexors L>R   Piriformis moderately tight B   Quadratus Lumborum increased tension B         Today's Treatment  TherEx LTR 2x20" B KTOS piriformis stretch x30"  B HS stretch with strap x30" Abdominal bracing/Pelvic tilt 10x5" Cat/camel 10x5" Prayer stretch 2x30"  (performed only to central position due to width of treatment table, but instructions provided for lateral stretch)           PT Education - 01/28/16 1015    Education provided Yes   Education Details PT eval findings, POC, Initial HEP & Posture and body mechanics for normal daily tasks   Person(s) Educated Patient   Methods Explanation;Demonstration;Handout   Comprehension Verbalized understanding;Returned demonstration;Need further instruction             PT Long Term Goals - 01/28/16 1019      PT LONG TERM GOAL #1   Title Independent with HEP by 02/25/16   Status New     PT LONG TERM GOAL #2   Title Lumbar ROM WFL w/o pain by 02/25/16   Status New     PT LONG TERM GOAL #3   Title B Hip strength >/= 4 to 4+/5 w/o pain for improved function by 02/25/16   Status New     PT LONG TERM GOAL #4   Title Pt will report ability to complete normal household chores  and workday tasks w/o limitation due to LBP or sciatica by 02/25/16   Status New     PT LONG TERM GOAL #5   Title Pt will return to normal work-out with good awarness of proper posture and body mechanics by 02/25/16   Status New  Plan - 01/28/16 1019    Clinical Impression Statement Pasqualina is a 55 y/o female who presents to OP PT for acute onset of bilateral low back pain with intermittent bilateral sciatica. Pain onset occurred 2 days after attempting a resisted squat with 50# while working out. No pain at time of exercise performance but woke up 2 days later in severe pain which progressively worsened over the next few days and progressed into abdomen. Abdominal work-up revealed multiple uterine fibroids as well as an ileus for which she was hospitalized last weekend. Ileus has resolved but LBP, intermittent sciatica, and lower abdominal pain still persist.  Pain at time of eval was 3/10 localized to left low back at lower lumbar level. Pt currently denies any radicular pain or symptoms, but has had intermittent numbness and tingling in B LE's to feet at times. Pt's chief complaint due to pain is inability to go to the gym. Assessment revealed grossly mild limitations in lumbar ROM with flexion more significantly limited and pt needing to slide hands down thighs to achieve flexion motion. Proximal LE musculature tightness/guarding noted, most pronounced in HS, piriformis and hip flexors, with increased muscle tension noted in B QL. Mild to moderate weakness noted in B hips (see above MMT) but some of this may also be related to muscle guarding. POC will focus on improving LE soft tissue pliability along with core/proximal stability training, postural training with emphasis on neutral spine alignment and proper body mechanics for daily task and working out, LE strengthening and manual therapy/modalities PRN for pain.   Rehab Potential Good   Clinical Impairments Affecting Rehab  Potential Recent ileus, uterine fibroids (potentially symptomatic)   PT Frequency 2x / week   PT Duration 4 weeks   PT Treatment/Interventions Patient/family education;Neuromuscular re-education;Therapeutic exercise;Manual techniques;Taping;Dry needling;Therapeutic activities;Functional mobility training;Electrical Stimulation;Moist Heat;Cryotherapy;Ultrasound;Traction;ADLs/Self Care Home Management   PT Next Visit Plan Review initial HEP; Lumbar/proximal LE flexibility and strengthening; Manual therapy and modalities PRN   Consulted and Agree with Plan of Care Patient      Patient will benefit from skilled therapeutic intervention in order to improve the following deficits and impairments:  Pain, Impaired flexibility, Decreased range of motion, Decreased strength, Postural dysfunction, Improper body mechanics, Increased muscle spasms, Decreased activity tolerance  Visit Diagnosis: Acute bilateral low back pain with bilateral sciatica     Problem List Patient Active Problem List   Diagnosis Date Noted  . Onychomycosis 02/19/2015  . Hallux limitus 02/19/2015  . Bunion 02/19/2015  . Pain in lower limb 02/19/2015    Percival Spanish, PT, MPT 01/28/2016, 11:50 AM  [facility] [address]  [place], [place], [address] Phone: [phone]   Fax:  [phone]  Name: Qiana Chesney MRN: [id number] Date of Birth: 11/10/1960

## 2016-01-28 NOTE — Patient Instructions (Addendum)

## 2016-02-03 ENCOUNTER — Ambulatory Visit: Payer: 59 | Admitting: Physical Therapy

## 2016-02-03 DIAGNOSIS — M5442 Lumbago with sciatica, left side: Principal | ICD-10-CM

## 2016-02-03 DIAGNOSIS — M5441 Lumbago with sciatica, right side: Secondary | ICD-10-CM | POA: Diagnosis not present

## 2016-02-03 NOTE — Therapy (Addendum)
[facility] [address]  [place], [place], [address] Phone: [phone]   Fax:  [phone]  Physical Therapy Treatment  Patient Details  Name: Jocelyn Howell MRN: [id number] Date of Birth: 1960-12-11 Referring Provider: Vedia Coffer, [facility]  Encounter Date: 02/03/2016      PT End of Session - 02/03/16 3614    Visit Number 2   Number of Visits 8   Date for PT Re-Evaluation 02/25/16   PT Start Time 0937  Pt arrived late   PT Stop Time 1015   PT Time Calculation (min) 38 min   Activity Tolerance Patient tolerated treatment well   Behavior During Therapy [facility] for tasks assessed/performed      Past Medical History:  Diagnosis Date  . ADD (attention deficit disorder)   . Anemia   . Dysrhythmia    irregular, not for 3 years  . Fibroids   . Heart murmur    years ago  . PONV (postoperative nausea and vomiting)     Past Surgical History:  Procedure Laterality Date  . APPENDECTOMY    . CESAREAN SECTION  1995  . COLON SURGERY    . DIAGNOSTIC LAPAROSCOPY    . ENDOMETRIAL ABLATION  ~ 2006  . HERNIA REPAIR  10/30/2012   "q/partial colectomy" (10/30/2012)  . LAPAROSCOPIC PARTIAL COLECTOMY  10/30/2012  . LAPAROSCOPIC PARTIAL COLECTOMY Right 10/30/2012   Procedure: LAPAROSCOPIC PARTIAL COLECTOMY;  Surgeon: Rolm Bookbinder, MD;  Location: [facility];  Service: General;  Laterality: Right;  removal right colon    There were no vitals filed for this visit.      Subjective Assessment - 02/03/16 0940    Subjective Pt feels likes she is getting back to her norm. Reports no issues with HEP, completing the exercise 5x since last PT visit. Pt reports she typically completes the HEP as she is getting out of bed in the morning.   Patient Stated Goals "To get back to baseline and my pre-therapy routine"   Currently in Pain? 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; addition/update to [employer]) Educated Patient   Methods Explanation;Demonstration;Handout   Comprehension Verbalized understanding;Returned demonstration             PT Long Term Goals - 02/03/16 0943      PT LONG TERM GOAL #1   Title Independent with HEP by 02/25/16   Status On-going     PT LONG TERM GOAL #2   Title Lumbar ROM WFL w/o pain by 02/25/16   Status On-going     PT LONG TERM GOAL #3   Title B Hip strength >/= 4 to 4+/5 w/o pain for improved function by 02/25/16   Status On-going     PT LONG TERM GOAL #4  Title Pt will report ability to complete normal household chores and workday tasks w/o limitation due to LBP or sciatica by 02/25/16   Status On-going     PT LONG TERM GOAL #5   Title Pt will return to normal work-out with good awarness of proper posture and body mechanics by 02/25/16   Status On-going               Plan - 02/03/16 0943    Clinical Impression Statement Pt reporting no issues with HEP. Upon review states she has been performing active HS stretch rather than passive, but denies any issues with this. Progressed lumbar stabilization exercises and updated HEP accordingly.   Rehab Potential Good   Clinical Impairments Affecting Rehab Potential Recent ileus, uterine fibroids (potentially symptomatic)   PT Treatment/Interventions Patient/family education;Neuromuscular re-education;Therapeutic exercise;Manual techniques;Taping;Dry  needling;Therapeutic activities;Functional mobility training;Electrical Stimulation;Moist Heat;Cryotherapy;Ultrasound;Traction;ADLs/Self Care Home Management   PT Next Visit Plan Review updated HEP as needed with continued weekly progression as tolerated; Lumbar/proximal LE flexibility and strengthening; Manual therapy and modalities PRN   Consulted and Agree with Plan of Care Patient      Patient will benefit from skilled therapeutic intervention in order to improve the following deficits and impairments:  Pain, Impaired flexibility, Decreased range of motion, Decreased strength, Postural dysfunction, Improper body mechanics, Increased muscle spasms, Decreased activity tolerance  Visit Diagnosis: Acute bilateral low back pain with bilateral sciatica     Problem List Patient Active Problem List   Diagnosis Date Noted  . Onychomycosis 02/19/2015  . Hallux limitus 02/19/2015  . Bunion 02/19/2015  . Pain in lower limb 02/19/2015    Percival Spanish, PT, MPT 02/03/2016, 10:17 AM  [facility] [address]  [place], [place], [address] Phone: [phone]   Fax:  [phone]  Name: Jocelyn Howell MRN: [id number] Date of Birth: 11-12-1960   PHYSICAL THERAPY DISCHARGE SUMMARY  Visits from Start of Care: 2  Current functional level related to goals / functional outcomes:   Unable to formally assess as pt only returned for 1 visit following the eval, but was already reporting that she felt like she "was getting back to her norm" with the initial HEP. When contacted to schedule further visits, pt reported she was going to the gym and requested to be discharged from PT.   Remaining deficits:   As above.   Education / Equipment:   Initial HEP  Plan: Patient agrees to discharge.  Patient goals were not met. Patient is being discharged due to the patient's request.  ?????     Percival Spanish, PT, MPT 02/24/16, 4:39  PM  [facility] [address]  [place], [place], [address] Phone: [phone]   Fax:  [phone]

## 2016-02-07 ENCOUNTER — Encounter: Payer: Self-pay | Admitting: Podiatry

## 2016-02-07 ENCOUNTER — Ambulatory Visit: Payer: 59

## 2016-02-07 ENCOUNTER — Ambulatory Visit (INDEPENDENT_AMBULATORY_CARE_PROVIDER_SITE_OTHER): Payer: 59 | Admitting: Podiatry

## 2016-02-07 VITALS — BP 136/78 | HR 73

## 2016-02-07 DIAGNOSIS — B351 Tinea unguium: Secondary | ICD-10-CM | POA: Diagnosis not present

## 2016-02-07 DIAGNOSIS — L600 Ingrowing nail: Secondary | ICD-10-CM | POA: Diagnosis not present

## 2016-02-07 DIAGNOSIS — M79673 Pain in unspecified foot: Secondary | ICD-10-CM | POA: Diagnosis not present

## 2016-02-07 DIAGNOSIS — M79672 Pain in left foot: Secondary | ICD-10-CM

## 2016-02-07 DIAGNOSIS — M79671 Pain in right foot: Secondary | ICD-10-CM

## 2016-02-07 NOTE — Patient Instructions (Signed)
Seen for problematic toe nails. All nails debrided. May brush fungal infected nails using Salsun blue shampoo.  Return as needed.

## 2016-02-07 NOTE — Progress Notes (Signed)
SUBJECTIVE: 55 y.o. year old female presents complaining of painful deformed toe nails that are hard to trim.  OBJECTIVE: DERMATOLOGIC EXAMINATION: Thick deformed and discolored nail with yellow debris under nail plate L1>R1. Dystrophic and hypertrophic nail border distal lateral end of 5th digits bilateral.   VASCULAR EXAMINATION OF LOWER LIMBS: All pedal pulses are palpable with normal pulsation.  No edema or erythema noted.  NEUROLOGIC EXAMINATION OF THE LOWER LIMBS: All epicritic and tactile sensations grossly intact.   MUSCULOSKELETAL EXAMINATION: Excess first ray motion in sagittal plane 1st ray bilateral L>R upon loading of forefoot, symptomatic under the first MPJ left with weight bearing. Bunion deformity bilateral. Limited dorsiflexion of the first MPJ motion with forefoot loading.  ASSESSMENT: Onychomycosis both great toe nails with pain. Dystrophic and painful nail 5th digits bilateral.   PLAN: All nails debrided. Discussed using scrub brush and Salsun blue shampoo for fungal infected nails. Patient wishes to avoid oral medication. Return as needed.

## 2016-02-09 ENCOUNTER — Encounter: Payer: 59 | Admitting: Physical Therapy

## 2016-03-28 MED FILL — GAVILYTE-G SOLUTION: 236 | 1 days supply | Qty: 4000 | Fill #0

## 2016-03-31 DIAGNOSIS — Z1211 Encounter for screening for malignant neoplasm of colon: Secondary | ICD-10-CM | POA: Diagnosis not present

## 2016-03-31 DIAGNOSIS — K5140 Inflammatory polyps of colon without complications: Secondary | ICD-10-CM | POA: Diagnosis not present

## 2016-03-31 DIAGNOSIS — K635 Polyp of colon: Secondary | ICD-10-CM | POA: Diagnosis not present

## 2016-03-31 DIAGNOSIS — D123 Benign neoplasm of transverse colon: Secondary | ICD-10-CM | POA: Diagnosis not present

## 2016-03-31 DIAGNOSIS — K621 Rectal polyp: Secondary | ICD-10-CM | POA: Diagnosis not present

## 2016-03-31 DIAGNOSIS — D128 Benign neoplasm of rectum: Secondary | ICD-10-CM | POA: Diagnosis not present

## 2016-04-13 MED FILL — DEXTROAMP-AMPHETAMIN 20 MG: 20 | 30 days supply | Qty: 60 | Fill #0

## 2016-06-28 MED FILL — VALACYCLOVIR HCL 500 MG TAB: 500 | 30 days supply | Qty: 60 | Fill #0

## 2016-07-03 MED FILL — DEXTROAMP-AMPHETAMIN 20 MG: 20 | 30 days supply | Qty: 60 | Fill #0

## 2016-09-14 MED FILL — DENTA 5000 PLUS CREAM: 1.1 | 20 days supply | Qty: 51 | Fill #0

## 2016-09-26 MED FILL — SF 1.1% GEL: 1.1 | 25 days supply | Qty: 56 | Fill #0

## 2016-10-02 MED FILL — DEXTROAMP-AMPHETAMIN 20 MG: 20 | 30 days supply | Qty: 60 | Fill #0

## 2016-10-19 DIAGNOSIS — H524 Presbyopia: Secondary | ICD-10-CM | POA: Diagnosis not present

## 2016-11-21 IMAGING — US US PELVIS COMPLETE
1 series · 13 of 25 positions shown · non-contrast
Comparison: CT abdomen pelvis dated 06/27/2014

CLINICAL DATA: Uterine fibroids

EXAM:
TRANSABDOMINAL AND TRANSVAGINAL ULTRASOUND OF PELVIS
TECHNIQUE: Both transabdominal and transvaginal ultrasound examinations of the
pelvis were performed. Transabdominal technique was performed for
global imaging of the pelvis including uterus, ovaries, adnexal
regions, and pelvic cul-de-sac. It was necessary to proceed with
endovaginal exam following the transabdominal exam to visualize the
uterus and bilateral adnexa.

[Series 1: us pelvis complete · 0.21mm/px · 13 of 50 slices shown]
[im 1/50]
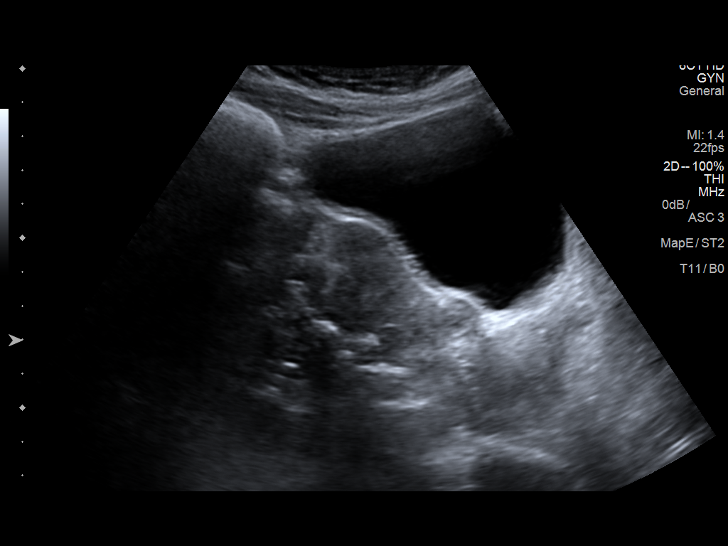
[im 5/50]
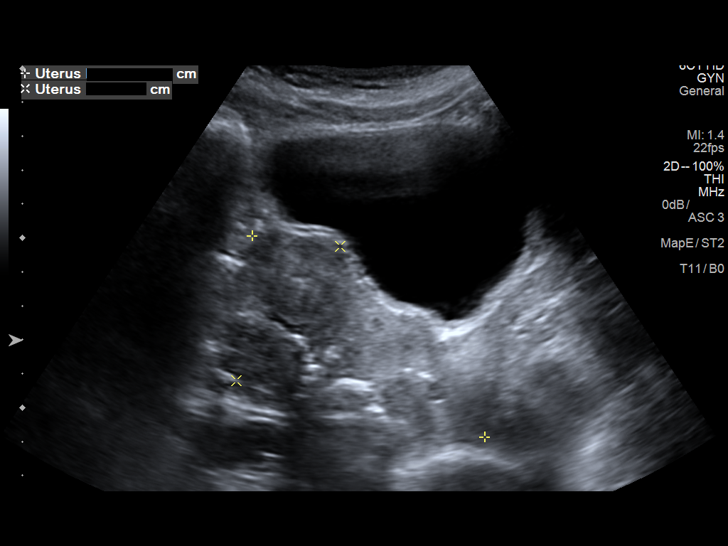
[im 9/50]
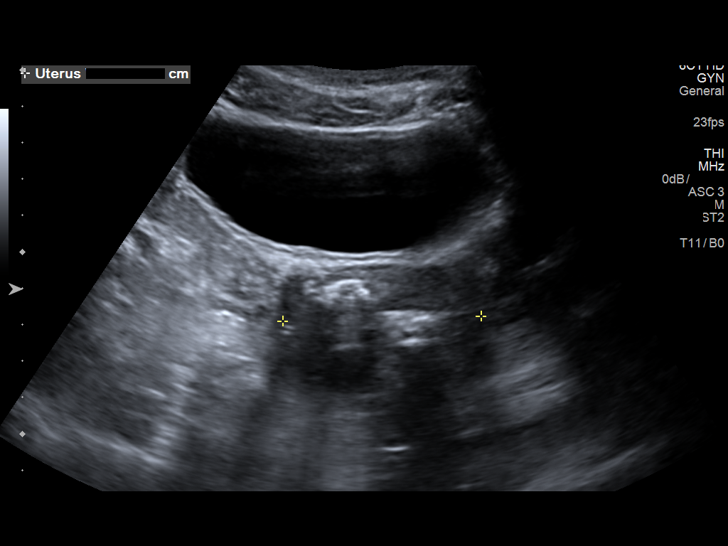
[im 13/50]
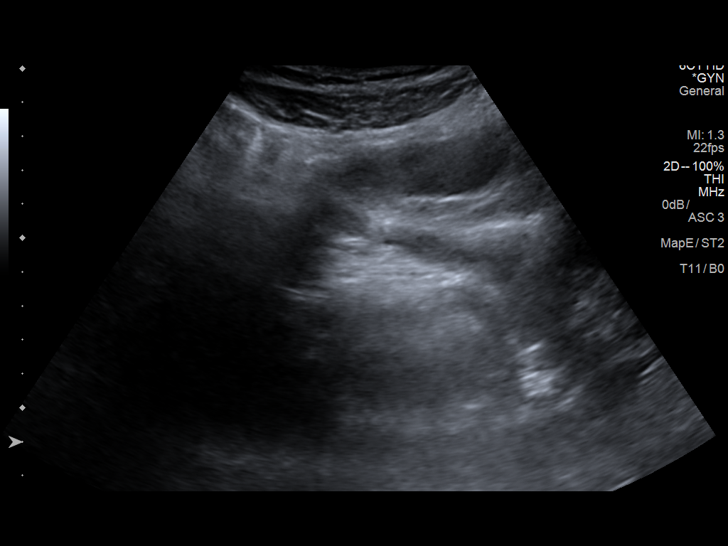
[im 17/50]
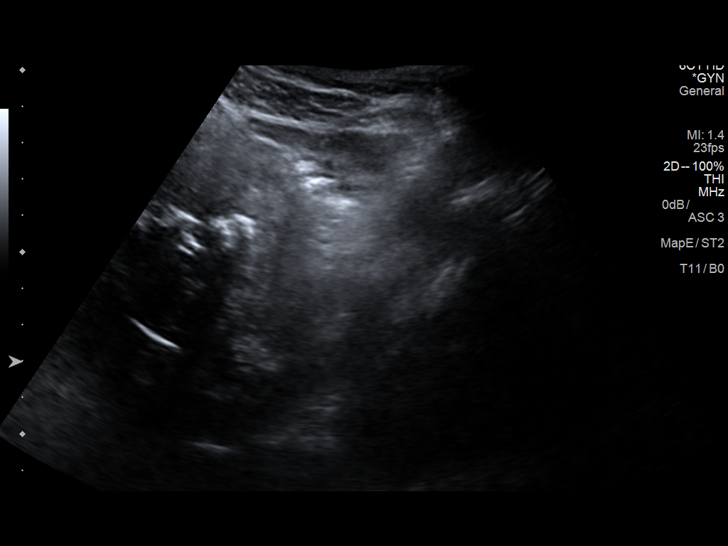
[im 21/50]
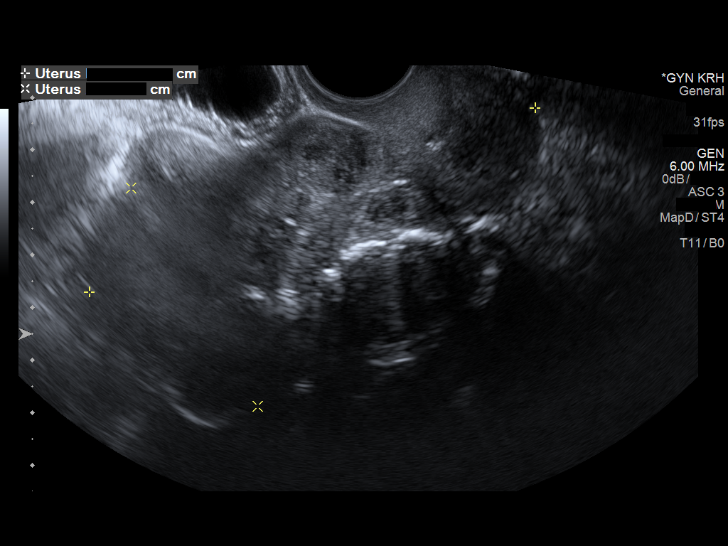
[im 25/50]
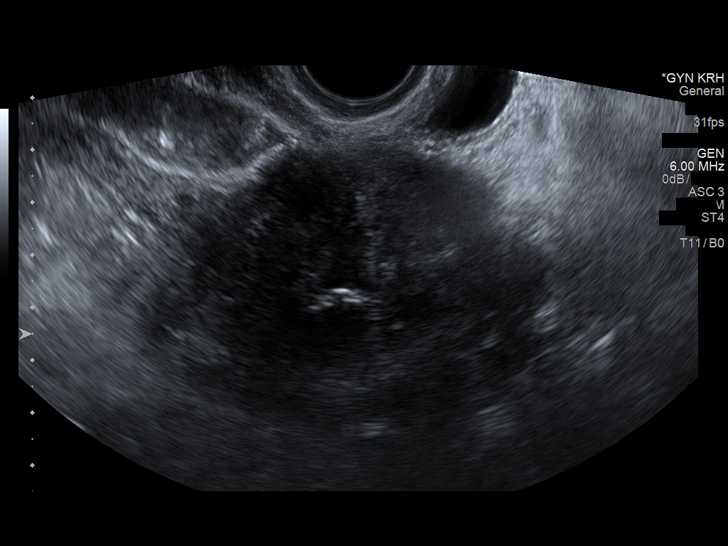
[im 29/50]
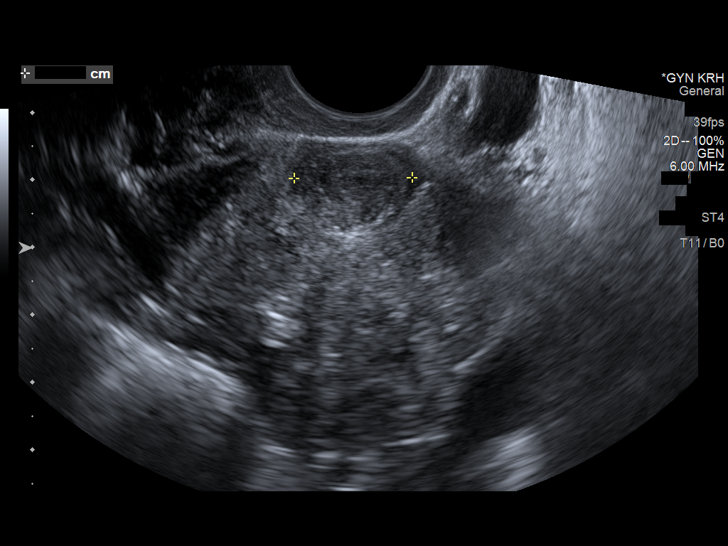
[im 33/50]
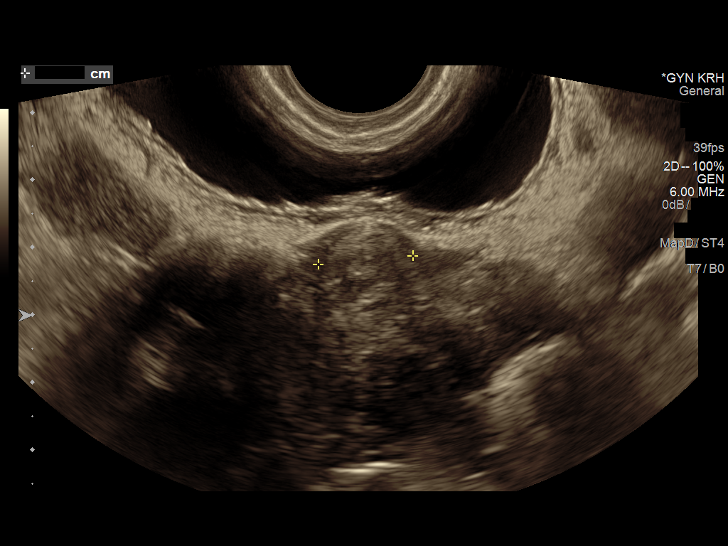
[im 37/50]
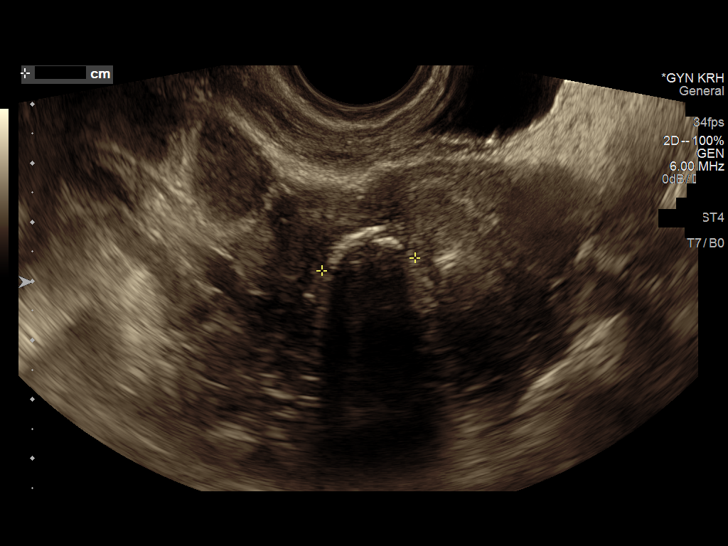
[im 41/50]
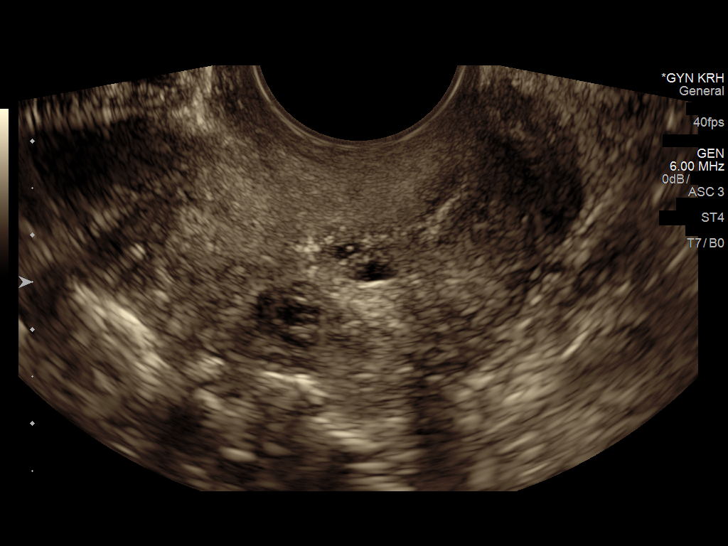
[im 45/50]
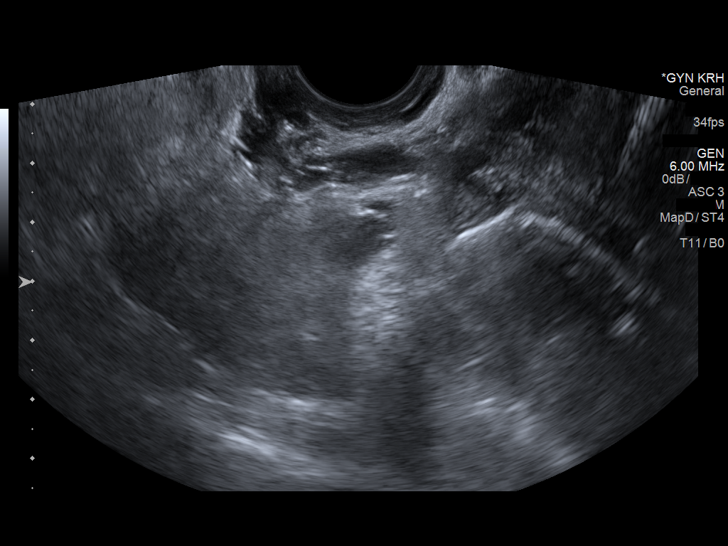
[im 50/50]
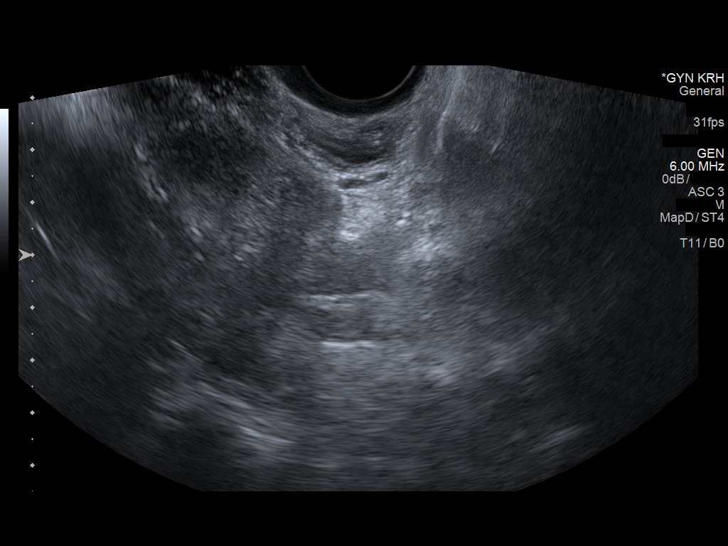

[13 of 25 positions shown; findings below may reference images not displayed]

FINDINGS: Uterus

Measurements: 9.2 x 4.8 x 7.0 cm. Multiple uterine fibroids,
partially calcified, including:

--1.5 x 1.8 x 1.5 cm subserosal fibroid in the left anterior uterine
body

--1.6 x 1.4 x 1.2 cm subserosal fibroid in the anterior uterine
fundus

--1.7 x 1.6 x 1.7 cm subserosal fibroid in the right anterior
uterine fundus

Endometrium

Thickness: 3 mm. Poorly visualized. No focal abnormality visualized.

Right ovary

Not discretely visualized.  No pelvic mass is seen.

Left ovary

Not discretely visualized.  No pelvic mass is seen.

Other findings

No abnormal free fluid.
IMPRESSION: Multiple uterine fibroids, including a 1.8 cm subserosal fibroid in
the left anterior uterine body, as above.

Bilateral ovaries are not discretely visualized.

## 2016-11-21 IMAGING — US US EXTREM LOW*L* LIMITED
1 series · 14 of 18 positions shown · non-contrast
Comparison: None

CLINICAL DATA: Palpable mass in the anterior aspect of the mid left
thigh.

EXAM:
ULTRASOUND LEFT LOWER EXTREMITY LIMITED
TECHNIQUE: Ultrasound examination of the lower extremity soft tissues was
performed in the area of clinical concern.

[Series 1: us extrem low*left* limited · 0.05mm/px · 14 of 18 slices shown]
[im 1/18]
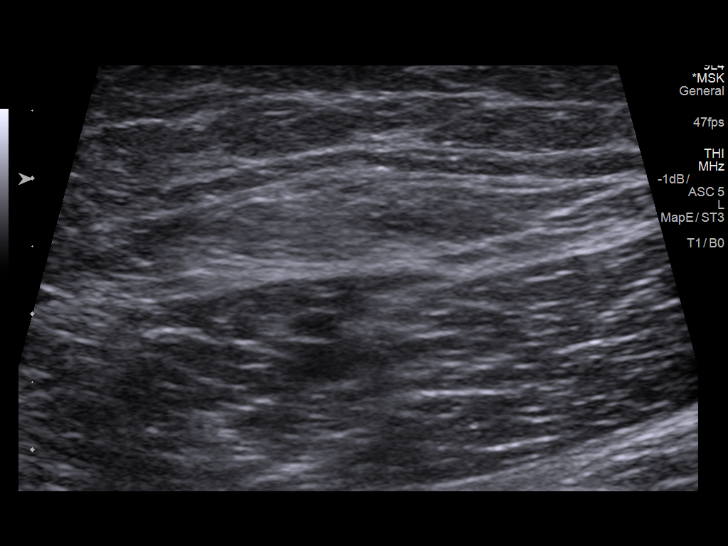
[im 2/18]
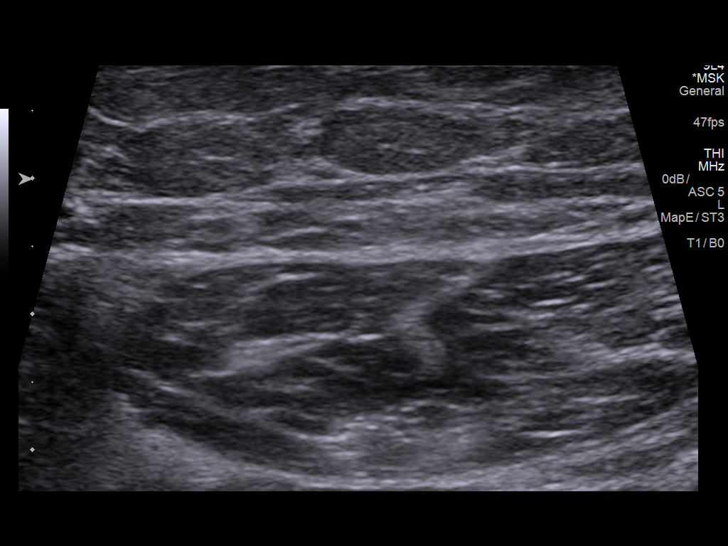
[im 4/18]
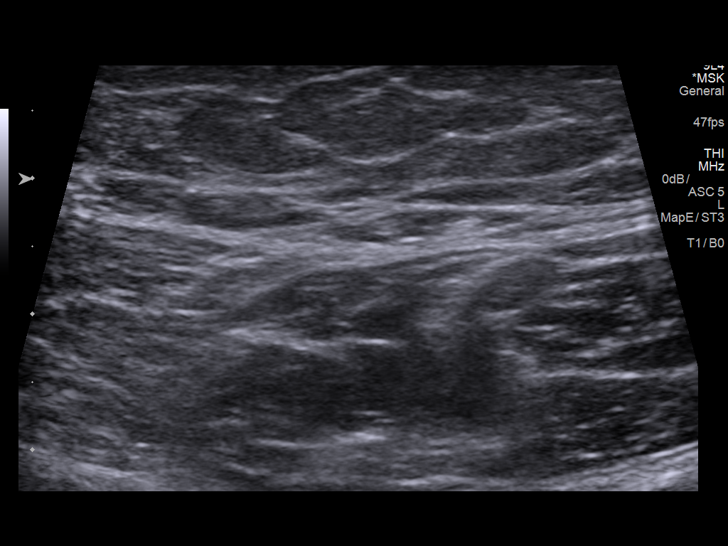
[im 5/18]
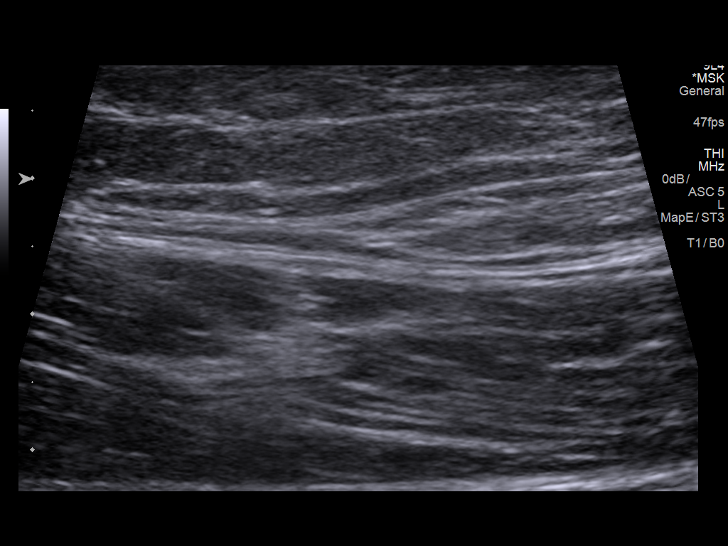
[im 6/18]
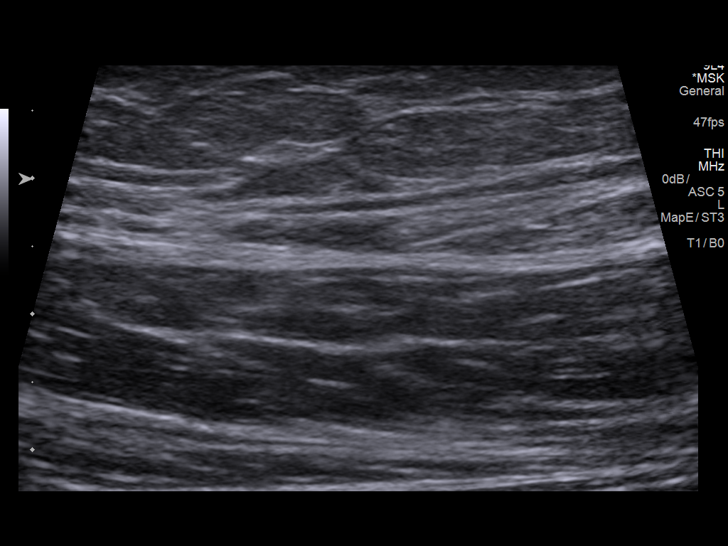
[im 8/18]
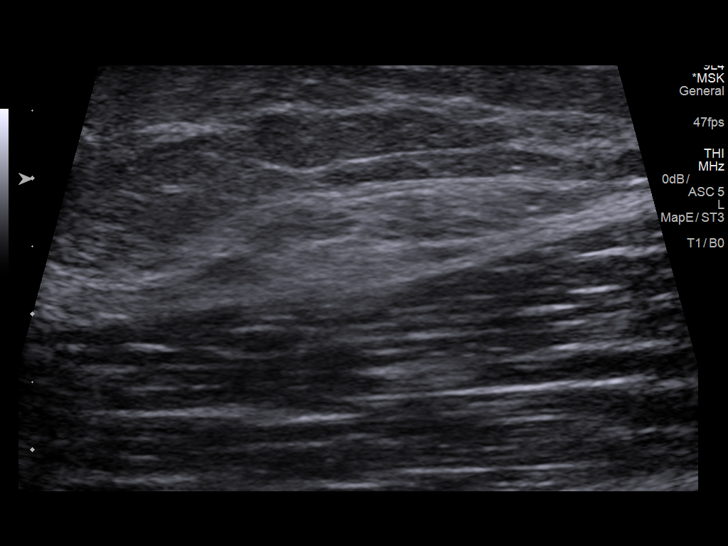
[im 9/18]
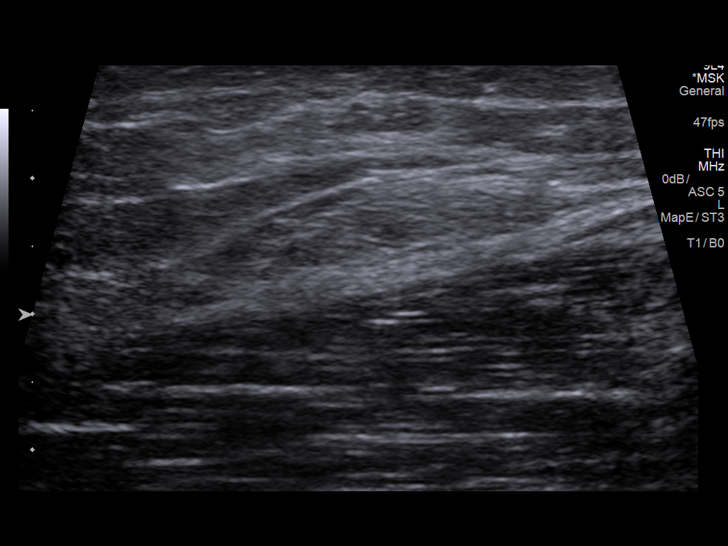
[im 10/18]
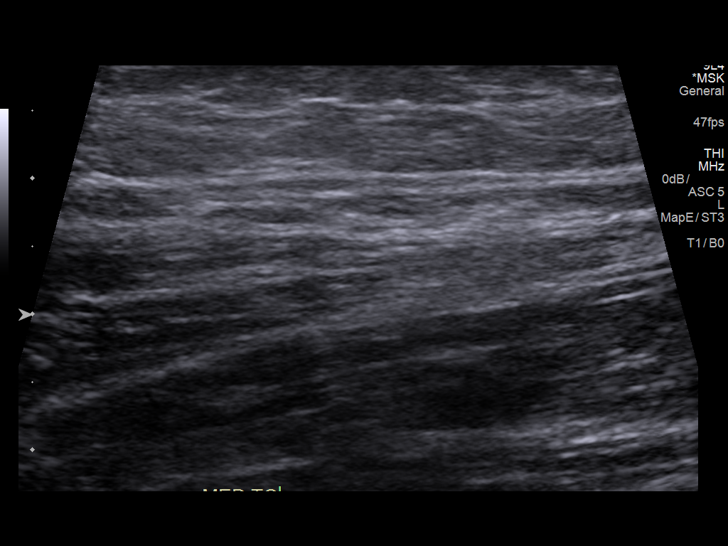
[im 11/18]
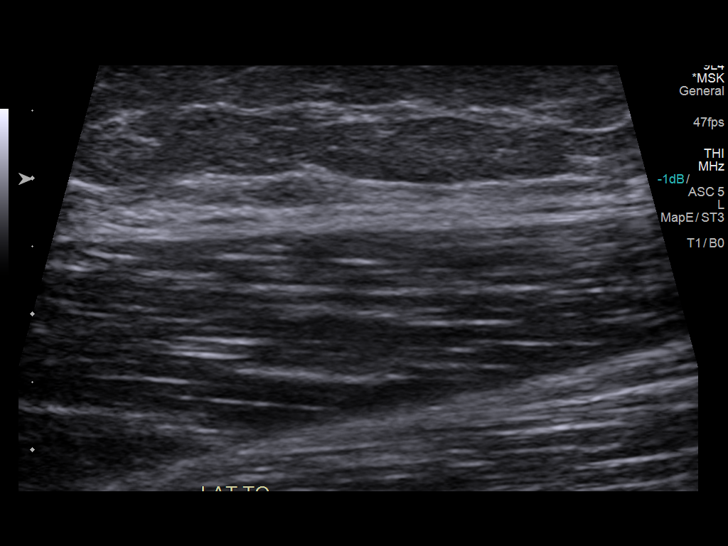
[im 13/18]
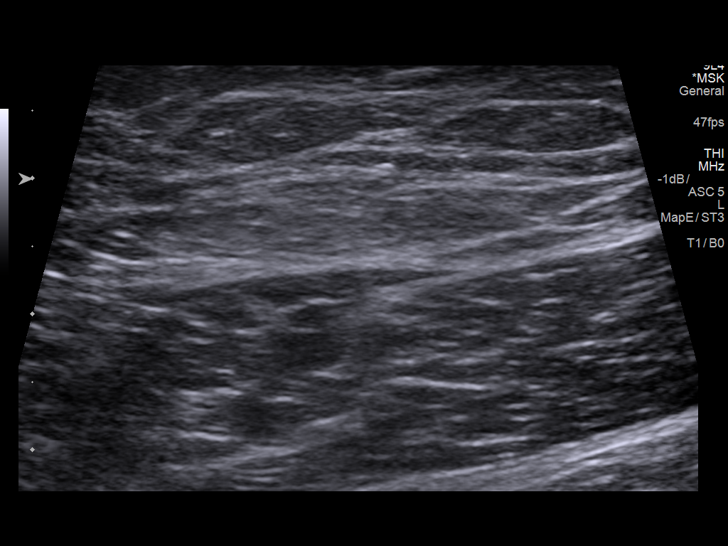
[im 14/18]
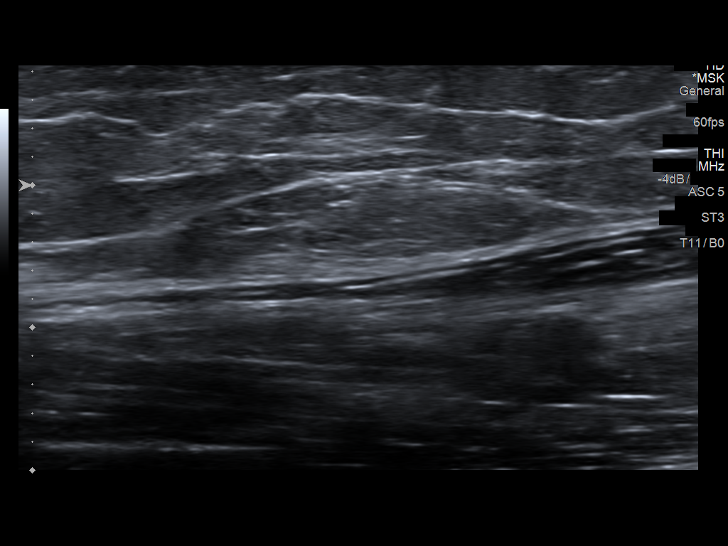
[im 15/18]
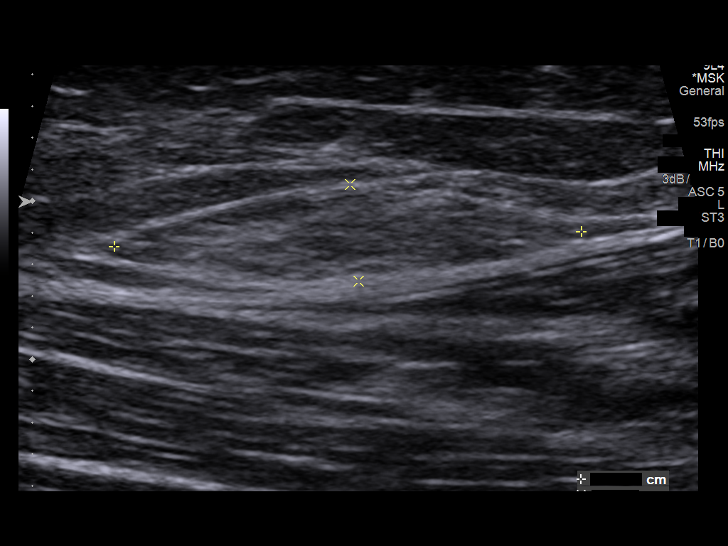
[im 17/18]
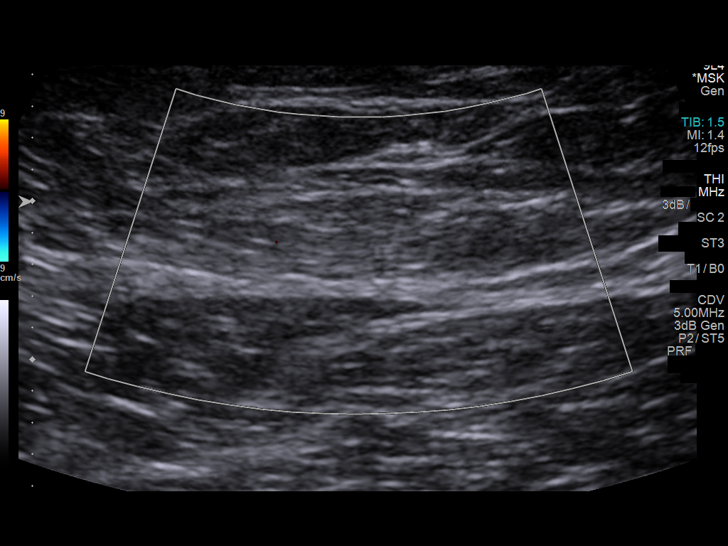
[im 18/18]
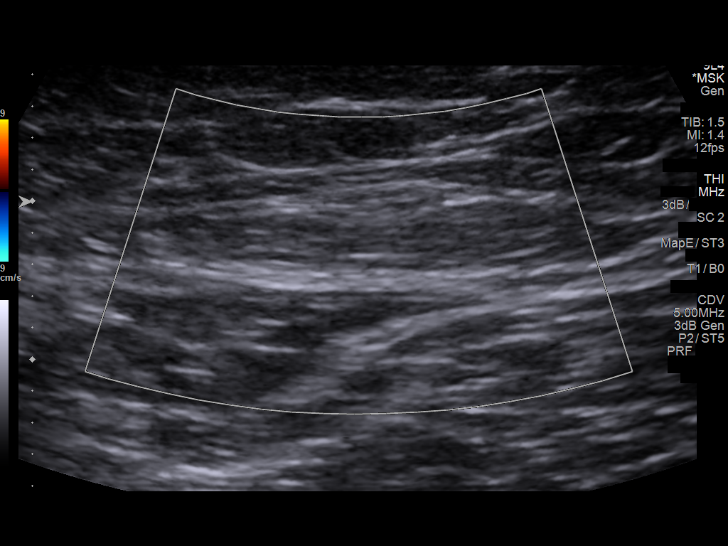

[14 of 18 positions shown; findings below may reference images not displayed]

FINDINGS: There is a 3.0 x 2.7 x 0.6 cm fairly well-defined area in the
subcutaneous fat of the mid left thigh superficial to the muscles. I
think this represents a focal benign subcutaneous lipoma. There is
no abnormal perfusion.
IMPRESSION: Benign appearing subcutaneous lipoma in the anterior aspect of the
left thigh.

## 2016-12-18 MED FILL — DEXTROAMP-AMPHETAMIN 20 MG: 20 | 30 days supply | Qty: 60 | Fill #0

## 2017-01-10 ENCOUNTER — Ambulatory Visit (INDEPENDENT_AMBULATORY_CARE_PROVIDER_SITE_OTHER): Payer: 59 | Admitting: Podiatry

## 2017-01-10 ENCOUNTER — Encounter: Payer: Self-pay | Admitting: Podiatry

## 2017-01-10 DIAGNOSIS — M79604 Pain in right leg: Secondary | ICD-10-CM

## 2017-01-10 DIAGNOSIS — L600 Ingrowing nail: Secondary | ICD-10-CM

## 2017-01-10 DIAGNOSIS — M79605 Pain in left leg: Secondary | ICD-10-CM

## 2017-01-10 NOTE — Progress Notes (Signed)
SUBJECTIVE: 56 y.o.year old femalepresents complaining of painful toe 5th bilatereal.  OBJECTIVE: DERMATOLOGIC EXAMINATION: Dystrophic and hypertrophic nail border distal lateral end of 5th digits bilateral.   VASCULAR EXAMINATION OF LOWER LIMBS: All pedal pulses are palpable with normal pulsation.  No edema or erythema noted.  NEUROLOGIC EXAMINATION OF THE LOWER LIMBS: All epicritic and tactile sensations grossly intact.   MUSCULOSKELETAL EXAMINATION: Excess first ray motion in sagittal plane 1st ray bilateral L>R upon loading of forefoot, symptomatic under the first MPJ left with weight bearing. Bunion deformity bilateral. Limited dorsiflexion of the first MPJ motion with forefoot loading.  ASSESSMENT: Dystrophic and painful nail 5th digits bilateral.   PLAN: Painful lesions debrided and pain was relieved. Return as needed.

## 2017-01-10 NOTE — Patient Instructions (Signed)
Debrided painful nail 5th toe bilateral.  Noted of ingrown nail at distal lateral nail growth. Return as needed.

## 2017-01-16 MED FILL — SF 5000 PLUS CREAM: 1.1 | 30 days supply | Qty: 51 | Fill #0

## 2017-02-21 DIAGNOSIS — E559 Vitamin D deficiency, unspecified: Secondary | ICD-10-CM | POA: Diagnosis not present

## 2017-02-21 DIAGNOSIS — R41840 Attention and concentration deficit: Secondary | ICD-10-CM | POA: Diagnosis not present

## 2017-02-21 MED FILL — DEXTROAMP-AMPHETAMIN 20 MG: 20 | 30 days supply | Qty: 60 | Fill #0

## 2017-02-21 MED FILL — VIT D2 1.25 MG (50,000 UNIT: 1.25 MG | 28 days supply | Qty: 4 | Fill #0 | Status: TO

## 2017-04-12 MED FILL — VIT D2 1.25 MG (50,000 UNIT: 1.25 MG | 84 days supply | Qty: 12 | Fill #0

## 2017-06-11 MED FILL — AMPHETAMINE-DEXTRO 20MG: 20 | 30 days supply | Qty: 60 | Fill #0

## 2017-08-09 DIAGNOSIS — N952 Postmenopausal atrophic vaginitis: Secondary | ICD-10-CM | POA: Insufficient documentation

## 2017-08-09 DIAGNOSIS — Z0000 Encounter for general adult medical examination without abnormal findings: Secondary | ICD-10-CM | POA: Diagnosis not present

## 2017-08-09 DIAGNOSIS — E559 Vitamin D deficiency, unspecified: Secondary | ICD-10-CM | POA: Insufficient documentation

## 2017-08-09 DIAGNOSIS — N924 Excessive bleeding in the premenopausal period: Secondary | ICD-10-CM | POA: Insufficient documentation

## 2017-08-09 MED FILL — DEXTROAMP-AMPHETAMIN 20 MG: 20 | 30 days supply | Qty: 60 | Fill #0

## 2017-09-04 ENCOUNTER — Encounter: Payer: Self-pay | Admitting: Podiatry

## 2017-09-04 ENCOUNTER — Ambulatory Visit: Payer: 59 | Admitting: Podiatry

## 2017-09-04 DIAGNOSIS — M79671 Pain in right foot: Secondary | ICD-10-CM

## 2017-09-04 DIAGNOSIS — M79672 Pain in left foot: Secondary | ICD-10-CM | POA: Diagnosis not present

## 2017-09-04 DIAGNOSIS — B351 Tinea unguium: Secondary | ICD-10-CM

## 2017-09-04 DIAGNOSIS — L600 Ingrowing nail: Secondary | ICD-10-CM

## 2017-09-04 NOTE — Patient Instructions (Signed)
Seen for painful toe. Abnormal skin lesion debrided from both 5th toe nails. Return as needed.

## 2017-09-04 NOTE — Progress Notes (Signed)
Subjective: 57 y.o. year old female patient presents complaining of pain feet on 5th toes from abnormal nail growth. Patient denies any new problems. Patient was seen on 01/10/17 for the same painful 5th toes.  Objective: Dermatologic: Thick yellow deformed nails at distal lateral aspect both 5th digits. Vascular: Pedal pulses are all palpable. Orthopedic: Contracted 5th digits with deformed nail border bilateral. Neurologic: All epicritic and tactile sensations grossly intact.  Assessment: Deformed nail with keratotic lesion distal lateral 5th bilateral. 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 Pain with closed in shoes.  Treatment: Debrided all deformed nails and keratotic lesion 5th toe bilateral. Pain was relieved. Return as needed.

## 2017-09-05 ENCOUNTER — Ambulatory Visit: Payer: 59 | Admitting: Podiatry

## 2017-09-18 DIAGNOSIS — Z0000 Encounter for general adult medical examination without abnormal findings: Secondary | ICD-10-CM | POA: Diagnosis not present

## 2017-11-01 MED FILL — AMPHETAMINE SALTS 20 MG TAB: 20 | 30 days supply | Qty: 60 | Fill #0

## 2018-01-03 MED FILL — PREVIDENT 5000 BOOSTER PLUS: 1.1 | 30 days supply | Qty: 100 | Fill #0

## 2018-01-23 DIAGNOSIS — R41840 Attention and concentration deficit: Secondary | ICD-10-CM | POA: Diagnosis not present

## 2018-01-23 DIAGNOSIS — B009 Herpesviral infection, unspecified: Secondary | ICD-10-CM | POA: Diagnosis not present

## 2018-01-23 MED FILL — DEXTROAMP-AMPHETAMIN 20 MG: 20 | 30 days supply | Qty: 60 | Fill #0

## 2018-01-23 MED FILL — VALACYCLOVIR HCL 500 MG TAB: 500 | 30 days supply | Qty: 60 | Fill #0

## 2018-03-26 DIAGNOSIS — R87610 Atypical squamous cells of undetermined significance on cytologic smear of cervix (ASC-US): Secondary | ICD-10-CM | POA: Diagnosis not present

## 2018-03-26 DIAGNOSIS — Z124 Encounter for screening for malignant neoplasm of cervix: Secondary | ICD-10-CM | POA: Diagnosis not present

## 2018-03-26 DIAGNOSIS — D252 Subserosal leiomyoma of uterus: Secondary | ICD-10-CM | POA: Insufficient documentation

## 2018-03-26 DIAGNOSIS — Z01419 Encounter for gynecological examination (general) (routine) without abnormal findings: Secondary | ICD-10-CM | POA: Diagnosis not present

## 2018-03-26 DIAGNOSIS — Z9889 Other specified postprocedural states: Secondary | ICD-10-CM | POA: Diagnosis not present

## 2018-04-08 DIAGNOSIS — R87610 Atypical squamous cells of undetermined significance on cytologic smear of cervix (ASC-US): Secondary | ICD-10-CM | POA: Insufficient documentation

## 2018-04-15 MED FILL — DEXTROAMP-AMPHETAMIN 20 MG: 20 | 30 days supply | Qty: 60 | Fill #0

## 2018-05-15 ENCOUNTER — Ambulatory Visit: Payer: 59 | Admitting: Podiatry

## 2018-05-15 ENCOUNTER — Encounter: Payer: Self-pay | Admitting: Podiatry

## 2018-05-15 VITALS — BP 132/84

## 2018-05-15 DIAGNOSIS — N941 Unspecified dyspareunia: Secondary | ICD-10-CM | POA: Insufficient documentation

## 2018-05-15 DIAGNOSIS — R399 Unspecified symptoms and signs involving the genitourinary system: Secondary | ICD-10-CM | POA: Insufficient documentation

## 2018-05-15 DIAGNOSIS — L84 Corns and callosities: Secondary | ICD-10-CM

## 2018-05-15 DIAGNOSIS — R3915 Urgency of urination: Secondary | ICD-10-CM | POA: Insufficient documentation

## 2018-05-15 NOTE — Patient Instructions (Signed)

## 2018-05-27 ENCOUNTER — Encounter: Payer: Self-pay | Admitting: Podiatry

## 2018-05-27 NOTE — Progress Notes (Signed)
Subjective: Jocelyn Howell presents today with chief complaint of painful corns and calluses bilaterally.  She has had this condition for a couple years.  Pain is aggravated when wearing enclosed shoe gear.  She has had professional care in the past from a podiatrist.  She is looking to establish care with new podiatrist because her podiatrist retired.    Past Medical History:  Diagnosis Date  . ADD (attention deficit disorder)   . Anemia   . Dysrhythmia    irregular, not for 3 years  . Fibroids   . Heart murmur    years ago  . PONV (postoperative nausea and vomiting)      Patient Active Problem List   Diagnosis Date Noted  . Symptoms involving urinary system 05/15/2018  . Unspecified dyspareunia (CODE) 05/15/2018  . Urinary urgency 05/15/2018  . Pap smear abnormality of cervix with ASCUS favoring benign 04/08/2018  . Fibroids, subserous 03/26/2018  . History of myomectomy 03/26/2018  . Abnormal perimenopausal bleeding 08/09/2017  . Perimenopausal atrophic vaginitis 08/09/2017  . Vitamin D deficiency 08/09/2017  . Ingrown nail 01/10/2017  . Onychomycosis 02/19/2015  . Hallux limitus 02/19/2015  . Bunion 02/19/2015  . Pain in lower limb 02/19/2015  . Acquired hallux rigidus 02/19/2015  . Anemia 12/04/2013  . Leukopenia 12/04/2013  . Pulmonary nodule 12/04/2013  . Tingling of skin 12/04/2013  . Polyp of colon 10/22/2013  . Attention and concentration deficit 09/12/2013  . Preventative health care 09/12/2013  . 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 APPENDECTOMY    . CESAREAN SECTION  1995  . COLON SURGERY    . DIAGNOSTIC LAPAROSCOPY    . ENDOMETRIAL ABLATION  ~ 2006  . HERNIA REPAIR  10/30/2012   "q/partial colectomy" (10/30/2012)  . LAPAROSCOPIC PARTIAL COLECTOMY  10/30/2012  . LAPAROSCOPIC PARTIAL COLECTOMY Right 10/30/2012   Procedure: LAPAROSCOPIC PARTIAL COLECTOMY;  Surgeon: Jocelyn Bookbinder, MD;  Location: [facility];   Service: General;  Laterality: Right;  removal right colon      Current Outpatient Medications:  .  amphetamine-dextroamphetamine (ADDERALL) 20 MG tablet, Take 20 mg by mouth daily., Disp: , Rfl:  .  docusate sodium 100 MG CAPS, Take 100 mg by mouth 2 (two) times daily. (Patient not taking: Reported on 05/15/2018), Disp: 30 capsule, Rfl: 2 .  oxyCODONE-acetaminophen (PERCOCET/ROXICET) 5-325 MG per tablet, Take 1-2 tablets by mouth every 4 (four) hours as needed. (Patient not taking: Reported on 05/15/2018), Disp: 30 tablet, Rfl: 0 .  polyethylene glycol (GOLYTELY) 236 G solution, Take 2,000 mLs by mouth once. (Patient not taking: Reported on 05/15/2018), Disp: 4000 mL, Rfl: 0 .  Vitamin D, Ergocalciferol, (DRISDOL) 50000 UNITS CAPS, Take 50,000 Units by mouth every 7 (seven) days., Disp: , Rfl:    Allergies  Allergen Reactions  . Other      Social History   Occupational History  . Not on file  Tobacco Use  . Smoking status: Never Smoker  . Smokeless tobacco: Never Used  Substance and Sexual Activity  . Alcohol use: Yes    Comment: 10/30/2012 "might have a drink q 3-4 months"  . Drug use: No  . Sexual activity: Yes    Birth control/protection: None     No family history on file.    There is no immunization history on file for this patient.   Review of systems: Positive Findings in bold print.  Constitutional:  chills, fatigue, fever, sweats, weight change Communication:  translator, sign Ecologist, hand writing, iPad/Android device Head: headaches, head injury Eyes: changes in vision, eye pain, glaucoma, cataracts, macular degeneration, diplopia, glare,  light sensitivity, eyeglasses or contacts, blindness Ears nose mouth throat: Hard of hearing, ringing in ears, deaf, sign language,  vertigo,   nosebleeds,  rhinitis,  cold sores, snoring, swollen glands Cardiovascular: HTN, edema, arrhythmia, pacemaker in place, defibrillator in place,  chest pain/tightness, chronic  anticoagulation, blood clot, heart failure Peripheral Vascular: leg cramps, varicose veins, blood clots, lymphedema Respiratory:  difficulty breathing, denies congestion, SOB, wheezing, cough, emphysema Gastrointestinal: change in appetite or weight, abdominal pain, constipation, diarrhea, nausea, vomiting, vomiting blood, change in bowel habits, abdominal pain, jaundice, rectal bleeding, hemorrhoids, Genitourinary:  nocturia,  pain on urination,  blood in urine, Foley catheter, urinary urgency Musculoskeletal: uses mobility aid,  cramping, stiff joints, painful joints, decreased joint motion, fractures, OA, gout Skin: +changes in toenails, color change, dryness, itching, mole changes,  rash  Neurological: headaches, numbness in feet, paresthesias in feet, burning in feet, fainting,  seizures, change in speech. denies headaches, memory problems/poor historian, cerebral palsy, weakness, paralysis Endocrine: diabetes, hypothyroidism, hyperthyroidism,  goiter, dry mouth, flushing, heat intolerance,  cold intolerance,  excessive thirst, denies polyuria,  nocturia Hematological:  easy bleeding, excessive bleeding, easy bruising, enlarged lymph nodes, on long term blood thinner, history of past transusions Allergy/immunological:  hives, eczema, frequent infections, multiple drug allergies, seasonal allergies, transplant recipient Psychiatric:  anxiety, depression, mood disorder, suicidal ideations, hallucinations   Objective: Vitals:   05/15/18 1012  BP: 132/84   Vascular Examination: Capillary refill time immediate x 10 digits Dorsalis pedis and posterior tibial pulses 2/4 b/l Sparse digital hair x 10 digits Skin temperature within normal limits bilaterally  Dermatological Examination: Skin with normal turgor, texture and tone  Toenails well manicured with adequate length.  Hyperkeratotic lesions noted submetatarsal heads 1, 2 left foot, lateral nail plate right fifth digit, dorsal PIPJ right  fifth digit.  Musculoskeletal: Muscle strength 5/5 to all LE muscle groups  Hallux abductovalgus with bunion deformity bilaterally  Hammertoe deformity digits 2 through 5 bilaterally  Neurological: Sensation intact with 10 gram monofilament. Vibratory sensation intact.  Assessment: Painful corns and callosities submetatarsal heads 1, 2 left foot, lateral nail plate right fifth digit, dorsal PIPJ right fifth digit  Plan: 1. Discussed diagnosis and treatment plan.  Patient agreed with treatment plan paring of painful corns and calluses. 2. Calluses pared submetatarsal heads 1, 2 left foot.  Corns pared lateral fifth digit right foot and dorsal PIPJ right fifth digit without incident.  Discussed padding of digits and appropriate shoe gear for deformities.  Recommended shoe gear with soft stretchable uppers. 3. Patient to continue soft, supportive shoe gear 4. Patient to report any pedal injuries to medical professional immediately. 5. Follow up 3 months. Patient/POA to call should there be a concern in the interim.

## 2018-06-01 ENCOUNTER — Telehealth (HOSPITAL_COMMUNITY): Payer: Self-pay | Admitting: Emergency Medicine

## 2018-06-01 NOTE — Telephone Encounter (Signed)
Entered in error

## 2018-07-08 MED FILL — AMPHETAMINE-DEXTROAMPHETAMI: 20 | 30 days supply | Qty: 60 | Fill #0

## 2018-08-06 DIAGNOSIS — Z86010 Personal history of colonic polyps: Secondary | ICD-10-CM | POA: Diagnosis not present

## 2018-08-06 DIAGNOSIS — R1011 Right upper quadrant pain: Secondary | ICD-10-CM | POA: Diagnosis not present

## 2018-08-06 DIAGNOSIS — R110 Nausea: Secondary | ICD-10-CM | POA: Diagnosis not present

## 2018-08-07 ENCOUNTER — Other Ambulatory Visit (HOSPITAL_COMMUNITY): Payer: Self-pay | Admitting: Gastroenterology

## 2018-08-07 ENCOUNTER — Other Ambulatory Visit: Payer: Self-pay | Admitting: Gastroenterology

## 2018-08-07 DIAGNOSIS — R1011 Right upper quadrant pain: Secondary | ICD-10-CM

## 2018-08-28 MED FILL — PREVIDENT 5000 BOOSTER PLUS: 1.1 | 30 days supply | Qty: 100 | Fill #0

## 2018-09-04 ENCOUNTER — Ambulatory Visit: Payer: 59 | Admitting: Podiatry

## 2018-09-25 ENCOUNTER — Ambulatory Visit (HOSPITAL_BASED_OUTPATIENT_CLINIC_OR_DEPARTMENT_OTHER): Admission: RE | Admit: 2018-09-25 | Payer: 59 | Source: Ambulatory Visit

## 2018-09-25 ENCOUNTER — Encounter (HOSPITAL_BASED_OUTPATIENT_CLINIC_OR_DEPARTMENT_OTHER): Payer: Self-pay

## 2018-09-25 DIAGNOSIS — F4322 Adjustment disorder with anxiety: Secondary | ICD-10-CM | POA: Diagnosis not present

## 2018-09-25 DIAGNOSIS — R41840 Attention and concentration deficit: Secondary | ICD-10-CM | POA: Diagnosis not present

## 2018-09-25 MED FILL — clonazePAM 0.5 MG TABS: 0.5 | 30 days supply | Qty: 60 | Fill #0

## 2018-09-25 MED FILL — AMPHETAMINE-DEXTROAMPHETAMI: 20 | 30 days supply | Qty: 60 | Fill #0

## 2018-12-26 MED FILL — AMPHETAMINE-DEXTROAMPHETAMI: 20 | 30 days supply | Qty: 60 | Fill #0

## 2019-01-06 MED FILL — VALACYCLOVIR HCL 500 MG TAB: 500 | 30 days supply | Qty: 60 | Fill #1

## 2019-02-18 DIAGNOSIS — H2513 Age-related nuclear cataract, bilateral: Secondary | ICD-10-CM | POA: Diagnosis not present

## 2019-02-18 DIAGNOSIS — H16223 Keratoconjunctivitis sicca, not specified as Sjogren's, bilateral: Secondary | ICD-10-CM | POA: Diagnosis not present

## 2019-04-01 MED FILL — AMPHETAMINE-DEXTROAMPHETAMI: 20 | 30 days supply | Qty: 60 | Fill #0

## 2019-04-04 DIAGNOSIS — F4322 Adjustment disorder with anxiety: Secondary | ICD-10-CM | POA: Diagnosis not present

## 2019-04-04 DIAGNOSIS — R41840 Attention and concentration deficit: Secondary | ICD-10-CM | POA: Diagnosis not present

## 2019-04-04 MED FILL — clonazePAM 0.5 MG TABS: 0.5 | 30 days supply | Qty: 60 | Fill #0

## 2019-04-15 MED FILL — clonazePAM 0.5 MG TABS: 0.5 | 30 days supply | Qty: 60 | Fill #0

## 2019-04-23 MED FILL — clonazePAM 0.5 MG TABS: 0.5 | 30 days supply | Qty: 60 | Fill #0

## 2019-04-23 MED FILL — VALACYCLOVIR HCL 500 MG TAB: 500 | 30 days supply | Qty: 60 | Fill #0

## 2019-05-15 DIAGNOSIS — M2560 Stiffness of unspecified joint, not elsewhere classified: Secondary | ICD-10-CM | POA: Diagnosis not present

## 2019-05-15 DIAGNOSIS — M546 Pain in thoracic spine: Secondary | ICD-10-CM | POA: Diagnosis not present

## 2019-05-15 DIAGNOSIS — M791 Myalgia, unspecified site: Secondary | ICD-10-CM | POA: Diagnosis not present

## 2019-05-21 DIAGNOSIS — M2560 Stiffness of unspecified joint, not elsewhere classified: Secondary | ICD-10-CM | POA: Diagnosis not present

## 2019-05-21 DIAGNOSIS — M791 Myalgia, unspecified site: Secondary | ICD-10-CM | POA: Diagnosis not present

## 2019-05-21 DIAGNOSIS — M546 Pain in thoracic spine: Secondary | ICD-10-CM | POA: Diagnosis not present

## 2019-05-29 DIAGNOSIS — M791 Myalgia, unspecified site: Secondary | ICD-10-CM | POA: Diagnosis not present

## 2019-05-29 DIAGNOSIS — M2560 Stiffness of unspecified joint, not elsewhere classified: Secondary | ICD-10-CM | POA: Diagnosis not present

## 2019-05-29 DIAGNOSIS — M546 Pain in thoracic spine: Secondary | ICD-10-CM | POA: Diagnosis not present

## 2019-07-01 MED FILL — PREVIDENT 5000 BOOSTER PLUS: 1.1 | 30 days supply | Qty: 100 | Fill #0

## 2019-08-01 MED FILL — AMPHETAMINE-DEXTROAMPHETAMI: 20 | 30 days supply | Qty: 60 | Fill #0

## 2019-09-18 DIAGNOSIS — R1013 Epigastric pain: Secondary | ICD-10-CM | POA: Diagnosis not present

## 2019-09-18 DIAGNOSIS — Z86010 Personal history of colonic polyps: Secondary | ICD-10-CM | POA: Diagnosis not present

## 2019-09-18 DIAGNOSIS — R140 Abdominal distension (gaseous): Secondary | ICD-10-CM | POA: Diagnosis not present

## 2019-09-18 MED FILL — PANTOPRAZOLE SOD DR 40 MG T: 40 | 90 days supply | Qty: 90 | Fill #0

## 2019-11-04 MED FILL — AMPHETAMINE-DEXTROAMPHETAMI: 20 | 30 days supply | Qty: 60 | Fill #0

## 2020-01-02 ENCOUNTER — Ambulatory Visit: Payer: 59 | Attending: Internal Medicine

## 2020-01-02 DIAGNOSIS — Z23 Encounter for immunization: Secondary | ICD-10-CM

## 2020-01-02 NOTE — Progress Notes (Signed)
° °  Covid-19 Vaccination Clinic  Name:  Christyl Osentoski    MRN: [id number] DOB: 1960/10/18  01/02/2020  Ms. Varano was observed post Covid-19 immunization for 15 minutes without incident. She was provided with Vaccine Information Sheet and instruction to access the V-Safe system.   Ms. Nash was instructed to call 911 with any severe reactions post vaccine:  Difficulty breathing   Swelling of face and throat   A fast heartbeat   A bad rash all over body   Dizziness and weakness

## 2020-01-06 ENCOUNTER — Ambulatory Visit: Payer: Self-pay

## 2020-01-12 DIAGNOSIS — L03012 Cellulitis of left finger: Secondary | ICD-10-CM | POA: Diagnosis not present

## 2020-01-12 MED FILL — CEPHALEXIN 500 MG CAPSULE: 500 | 10 days supply | Qty: 40 | Fill #0

## 2020-02-02 ENCOUNTER — Other Ambulatory Visit (HOSPITAL_BASED_OUTPATIENT_CLINIC_OR_DEPARTMENT_OTHER): Payer: Self-pay | Admitting: Family Medicine

## 2020-02-02 DIAGNOSIS — R1901 Right upper quadrant abdominal swelling, mass and lump: Secondary | ICD-10-CM | POA: Diagnosis not present

## 2020-02-02 DIAGNOSIS — R41840 Attention and concentration deficit: Secondary | ICD-10-CM | POA: Diagnosis not present

## 2020-02-02 MED FILL — AMPHETAMINE-DEXTROAMPHETAMI: 20 | 30 days supply | Qty: 60 | Fill #0

## 2020-02-11 MED FILL — AMPHETAMINE-DEXTROAMPHETAMI: 20 | 30 days supply | Qty: 60 | Fill #0

## 2020-04-06 ENCOUNTER — Other Ambulatory Visit: Payer: Self-pay | Admitting: Gastroenterology

## 2020-04-06 ENCOUNTER — Other Ambulatory Visit (HOSPITAL_COMMUNITY): Payer: Self-pay | Admitting: Gastroenterology

## 2020-04-06 DIAGNOSIS — Z1211 Encounter for screening for malignant neoplasm of colon: Secondary | ICD-10-CM | POA: Diagnosis not present

## 2020-04-06 DIAGNOSIS — Z86010 Personal history of colonic polyps: Secondary | ICD-10-CM | POA: Diagnosis not present

## 2020-04-06 DIAGNOSIS — R1011 Right upper quadrant pain: Secondary | ICD-10-CM | POA: Diagnosis not present

## 2020-04-06 DIAGNOSIS — R110 Nausea: Secondary | ICD-10-CM

## 2020-04-13 ENCOUNTER — Ambulatory Visit (HOSPITAL_BASED_OUTPATIENT_CLINIC_OR_DEPARTMENT_OTHER): Payer: 59

## 2020-04-16 ENCOUNTER — Other Ambulatory Visit (HOSPITAL_BASED_OUTPATIENT_CLINIC_OR_DEPARTMENT_OTHER): Payer: Self-pay | Admitting: Family Medicine

## 2020-04-19 ENCOUNTER — Ambulatory Visit (HOSPITAL_BASED_OUTPATIENT_CLINIC_OR_DEPARTMENT_OTHER): Admission: RE | Admit: 2020-04-19 | Payer: 59 | Source: Ambulatory Visit

## 2020-04-19 ENCOUNTER — Encounter (HOSPITAL_BASED_OUTPATIENT_CLINIC_OR_DEPARTMENT_OTHER): Payer: Self-pay

## 2020-04-19 MED FILL — AMPHETAMINE-DEXTROAMPHETAMI: 20 | 10 days supply | Qty: 20 | Fill #0

## 2020-04-22 ENCOUNTER — Ambulatory Visit (HOSPITAL_BASED_OUTPATIENT_CLINIC_OR_DEPARTMENT_OTHER)
Admission: RE | Admit: 2020-04-22 | Discharge: 2020-04-22 | Disposition: A | Discharge status: Home / Self Care | Payer: 59 | Source: Ambulatory Visit | Attending: Gastroenterology | Admitting: Gastroenterology

## 2020-04-22 ENCOUNTER — Other Ambulatory Visit: Payer: Self-pay

## 2020-04-22 DIAGNOSIS — R1011 Right upper quadrant pain: Secondary | ICD-10-CM | POA: Diagnosis not present

## 2020-04-22 DIAGNOSIS — R110 Nausea: Secondary | ICD-10-CM | POA: Insufficient documentation

## 2020-05-17 ENCOUNTER — Other Ambulatory Visit (HOSPITAL_BASED_OUTPATIENT_CLINIC_OR_DEPARTMENT_OTHER): Payer: Self-pay | Admitting: Family Medicine

## 2020-05-17 DIAGNOSIS — R110 Nausea: Secondary | ICD-10-CM | POA: Insufficient documentation

## 2020-05-17 DIAGNOSIS — R140 Abdominal distension (gaseous): Secondary | ICD-10-CM | POA: Insufficient documentation

## 2020-05-17 DIAGNOSIS — R41840 Attention and concentration deficit: Secondary | ICD-10-CM | POA: Diagnosis not present

## 2020-05-17 DIAGNOSIS — R194 Change in bowel habit: Secondary | ICD-10-CM | POA: Insufficient documentation

## 2020-05-17 MED FILL — AMPHETAMINE-DEXTROAMPHETAMI: 20 | 30 days supply | Qty: 60 | Fill #0

## 2020-05-31 MED FILL — AMPHETAMINE-DEXTROAMPHETAMI: 20 | 30 days supply | Qty: 60 | Fill #0

## 2020-07-22 ENCOUNTER — Other Ambulatory Visit (HOSPITAL_BASED_OUTPATIENT_CLINIC_OR_DEPARTMENT_OTHER): Payer: Self-pay | Admitting: Family Medicine

## 2020-07-22 MED FILL — AMPHETAMINE-DEXTROAMPHETAMI: 20 | 30 days supply | Qty: 60 | Fill #0

## 2020-07-26 ENCOUNTER — Other Ambulatory Visit (HOSPITAL_BASED_OUTPATIENT_CLINIC_OR_DEPARTMENT_OTHER): Payer: Self-pay

## 2020-07-27 ENCOUNTER — Other Ambulatory Visit (HOSPITAL_BASED_OUTPATIENT_CLINIC_OR_DEPARTMENT_OTHER): Payer: Self-pay

## 2020-07-27 MED ORDER — TRETINOIN 0.1 % EX CREA
TOPICAL_CREAM | CUTANEOUS | 1 refills | Status: AC
  Filled 2020-07-27: qty 45, 30d supply, fill #0
  Filled 2021-06-21: qty 45, 15d supply, fill #0

## 2020-07-28 ENCOUNTER — Other Ambulatory Visit (HOSPITAL_BASED_OUTPATIENT_CLINIC_OR_DEPARTMENT_OTHER): Payer: Self-pay

## 2020-07-29 ENCOUNTER — Other Ambulatory Visit (HOSPITAL_BASED_OUTPATIENT_CLINIC_OR_DEPARTMENT_OTHER): Payer: Self-pay

## 2020-08-05 ENCOUNTER — Other Ambulatory Visit (HOSPITAL_BASED_OUTPATIENT_CLINIC_OR_DEPARTMENT_OTHER): Payer: Self-pay

## 2020-10-19 ENCOUNTER — Other Ambulatory Visit (HOSPITAL_BASED_OUTPATIENT_CLINIC_OR_DEPARTMENT_OTHER): Payer: Self-pay

## 2020-10-19 DIAGNOSIS — Z13228 Encounter for screening for other metabolic disorders: Secondary | ICD-10-CM | POA: Diagnosis not present

## 2020-10-19 DIAGNOSIS — Z1329 Encounter for screening for other suspected endocrine disorder: Secondary | ICD-10-CM | POA: Diagnosis not present

## 2020-10-19 DIAGNOSIS — Z131 Encounter for screening for diabetes mellitus: Secondary | ICD-10-CM | POA: Diagnosis not present

## 2020-10-19 DIAGNOSIS — L709 Acne, unspecified: Secondary | ICD-10-CM | POA: Diagnosis not present

## 2020-10-19 DIAGNOSIS — Z13220 Encounter for screening for lipoid disorders: Secondary | ICD-10-CM | POA: Diagnosis not present

## 2020-10-19 DIAGNOSIS — Z0000 Encounter for general adult medical examination without abnormal findings: Secondary | ICD-10-CM | POA: Diagnosis not present

## 2020-10-19 DIAGNOSIS — R41840 Attention and concentration deficit: Secondary | ICD-10-CM | POA: Diagnosis not present

## 2020-10-19 DIAGNOSIS — Z79899 Other long term (current) drug therapy: Secondary | ICD-10-CM | POA: Diagnosis not present

## 2020-10-19 DIAGNOSIS — B009 Herpesviral infection, unspecified: Secondary | ICD-10-CM | POA: Diagnosis not present

## 2020-10-19 MED ORDER — VALACYCLOVIR HCL 500 MG PO TABS
ORAL_TABLET | ORAL | 5 refills | Status: AC
  Filled 2020-10-19 – 2020-11-01 (×2): qty 60, 30d supply, fill #0
  Filled 2021-06-21: qty 60, 30d supply, fill #1

## 2020-10-19 MED ORDER — AMPHETAMINE-DEXTROAMPHETAMINE 20 MG PO TABS
ORAL_TABLET | ORAL | 0 refills | Status: AC
  Filled 2020-10-19 – 2020-11-01 (×2): qty 60, 30d supply, fill #0

## 2020-10-26 ENCOUNTER — Other Ambulatory Visit (HOSPITAL_BASED_OUTPATIENT_CLINIC_OR_DEPARTMENT_OTHER): Payer: Self-pay

## 2020-11-01 ENCOUNTER — Other Ambulatory Visit (HOSPITAL_BASED_OUTPATIENT_CLINIC_OR_DEPARTMENT_OTHER): Payer: Self-pay

## 2020-11-09 ENCOUNTER — Other Ambulatory Visit (HOSPITAL_COMMUNITY): Payer: Self-pay

## 2020-11-09 MED ORDER — CARESTART COVID-19 HOME TEST VI KIT
PACK | 0 refills | Status: AC
  Filled 2020-11-09: qty 4, 4d supply, fill #0

## 2020-11-10 ENCOUNTER — Other Ambulatory Visit (HOSPITAL_COMMUNITY): Payer: Self-pay

## 2020-11-12 ENCOUNTER — Telehealth: Payer: Self-pay | Admitting: Emergency Medicine

## 2020-11-12 ENCOUNTER — Other Ambulatory Visit: Payer: Self-pay

## 2020-11-12 ENCOUNTER — Emergency Department
Admission: EM | Admit: 2020-11-12 | Discharge: 2020-11-12 | Disposition: A | Discharge status: Home / Self Care | Payer: 59 | Source: Home / Self Care

## 2020-11-12 ENCOUNTER — Other Ambulatory Visit (HOSPITAL_BASED_OUTPATIENT_CLINIC_OR_DEPARTMENT_OTHER): Payer: Self-pay

## 2020-11-12 DIAGNOSIS — J029 Acute pharyngitis, unspecified: Secondary | ICD-10-CM

## 2020-11-12 DIAGNOSIS — J069 Acute upper respiratory infection, unspecified: Secondary | ICD-10-CM

## 2020-11-12 LAB — POCT RAPID STREP A (OFFICE): Rapid Strep A Screen: NEGATIVE

## 2020-11-12 MED ORDER — PREDNISONE 20 MG PO TABS
20.0000 mg | ORAL_TABLET | Freq: Two times a day (BID) | ORAL | 0 refills | Status: AC
  Filled 2020-11-12: qty 6, 3d supply, fill #0

## 2020-11-12 MED ORDER — BENZONATATE 100 MG PO CAPS
100.0000 mg | ORAL_CAPSULE | Freq: Three times a day (TID) | ORAL | 0 refills | Status: AC
  Filled 2020-11-12: qty 21, 7d supply, fill #0

## 2020-11-12 NOTE — Discharge Instructions (Addendum)
Strep test negative. COVID and flu tests sent to lab - these will result in [place]. Continue OTC medicine as needed. Take prednisone as prescribed to help with swallowing and cough medication as prescribed. Rest and keep hydrated. Recommend isolate until COVID results return.  Go to the ER if develop difficulty breathing.

## 2020-11-12 NOTE — ED Triage Notes (Signed)
Pt c/o sore throat since Tues. Denies any other sxs. No fever. Cepacol prn. Salt water gargles. Pain 10/10. 2 at home neg covid tests. (Last night)

## 2020-11-12 NOTE — ED Provider Notes (Signed)
[facility]    CSN: [id number] Arrival date & time: 11/12/20  0932      History   Chief Complaint Chief Complaint  Patient presents with   Sore Throat    HPI Jocelyn Howell is a 60 y.o. female.   Patient presents with concerns of sore throat and cough since Wednesday. She states she started feeling bad around Monday with some headache, body aches, and fatigue. She has had some mild congestion and an episode of diarrhea as well. The patient is most bothered by the sore throat which worsened yesterday. Her cough is dry and she denies difficulty breathing or history of asthma or lung problems. She has taken Tylenol and used Cepacol with only mild temporary improvement. She took two [place] home tests, one on Wednesday and one on Thursday, that were both negative. The patient works in the [place] and states she was just told by a coworker that they tested positive for COVID. She denies known fever, vomiting, or dizziness.   The history is provided by the patient.  Sore Throat This is a new problem. The current episode started 2 days ago. Associated symptoms include headaches. Pertinent negatives include no chest pain and no shortness of breath.   Past Medical History:  Diagnosis Date   ADD (attention deficit disorder)    Anemia    Dysrhythmia    irregular, not for 3 years   Fibroids    Heart murmur    years ago   PONV (postoperative nausea and vomiting)     Patient Active Problem List   Diagnosis Date Noted   Symptoms involving urinary system 05/15/2018   Unspecified dyspareunia (CODE) 05/15/2018   Urinary urgency 05/15/2018   Pap smear abnormality of cervix with ASCUS favoring benign 04/08/2018   Fibroids, subserous 03/26/2018   History of myomectomy 03/26/2018   Abnormal perimenopausal bleeding 08/09/2017   Perimenopausal atrophic vaginitis 08/09/2017   Vitamin D deficiency 08/09/2017   Ingrown nail 01/10/2017   Onychomycosis 02/19/2015   Hallux limitus  02/19/2015   Bunion 02/19/2015   Pain in lower limb 02/19/2015   Acquired hallux rigidus 02/19/2015   Anemia 12/04/2013   Leukopenia 12/04/2013   Pulmonary nodule 12/04/2013   Tingling of skin 12/04/2013   Polyp of colon 10/22/2013   Attention and concentration deficit 09/12/2013   Preventative health care 09/12/2013   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      Home Medications    Prior to Admission medications   Medication Sig Start Date End Date Taking? Authorizing Provider  benzonatate (TESSALON) 100 MG capsule Take 1 capsule (100 mg total) by mouth every 8 (eight) hours. 11/12/20  Yes Florella Mcneese L, PA  predniSONE (DELTASONE) 20 MG tablet Take 1 tablet (20 mg total) by mouth 2 (two) times daily with a meal for 3 days. 11/12/20 11/15/20 Yes Breniya Goertzen L, PA  amphetamine-dextroamphetamine (ADDERALL) 20 MG tablet Take 20  mg by mouth daily.    [provider]  amphetamine-dextroamphetamine (ADDERALL) 20 MG tablet TAKE 1 TABLET BY MOUTH TWICE DAILY 05/17/20 11/13/20  Fulbright, [place] E, PA-C  amphetamine-dextroamphetamine (ADDERALL) 20 MG tablet TAKE 1 TABLET BY MOUTH 2 TIMES DAILY FOR 10 DAYS **NEEDS OFFICE VISIT FOR FURTHER REFILLS** 04/16/20 10/13/20  Robyne Peers, MD  amphetamine-dextroamphetamine (ADDERALL) 20 MG tablet TAKE 1 TABLET (20 MG TOTAL) BY MOUTH 2 TIMES DAILY. NEEDS OV 02/02/20 07/31/20  Fulbright, [place] E, PA-C   amphetamine-dextroamphetamine (ADDERALL) 20 MG tablet TAKE 1 TABLET (20 MG TOTAL) BY MOUTH 2 TIMES DAILY. 07/22/20 01/18/21  Fulbright, [place] E, PA-C  amphetamine-dextroamphetamine (ADDERALL) 20 MG tablet Take 1 tablet (20 mg total) by mouth 2 times daily. 10/19/20     COVID-19 At Home Antigen Test [facility] COVID-19 HOME TEST) KIT Use as directed 11/09/20   Marisa Cyphers, [facility]  docusate sodium 100 MG CAPS Take 100 mg by mouth 2 (two) times daily. Patient not taking: Reported on 05/15/2018 11/04/12   Rolm Bookbinder, MD  oxyCODONE-acetaminophen (PERCOCET/ROXICET) 5-325 MG per tablet Take 1-2 tablets by mouth every 4 (four) hours as needed. Patient not taking: Reported on 05/15/2018 11/04/12   Rolm Bookbinder, MD  polyethylene glycol (GOLYTELY) 236 G solution Take 2,000 mLs by mouth once. Patient not taking: Reported on 05/15/2018 04/21/13   Orpah Greek, MD  tretinoin (RETIN-A) 0.1 % cream APPLY TOPICALLY NIGHTLY. 07/22/20 07/22/21  Fulbright, [place] E, PA-C  tretinoin (RETIN-A) 0.1 % cream Apply topically nightly. 07/27/20     valACYclovir (VALTREX) 500 MG tablet Take 1 tablet (500 mg total) by mouth 2 times daily. 10/19/20     Vitamin D, Ergocalciferol, (DRISDOL) 50000 UNITS CAPS Take 50,000 Units by mouth every 7 (seven) days.    [provider]    Family History History reviewed. No pertinent family history.  Social History Social History   Tobacco Use   Smoking status: Never   Smokeless tobacco: Never  Substance Use Topics   Alcohol use: Yes    Comment: 10/30/2012 "might have a drink q 3-4 months"   Drug use: No     Allergies   Other   Review of Systems Review of Systems  Constitutional:  Positive for fatigue. Negative for fever.  HENT:  Positive for congestion, sore throat and trouble swallowing (due to discomfort). Negative for ear pain, rhinorrhea and voice change.   Respiratory:  Positive for cough. Negative for chest tightness, shortness of breath  and wheezing.   Cardiovascular:  Negative for chest pain.  Gastrointestinal:  Positive for diarrhea. Negative for nausea and vomiting.  Musculoskeletal:  Positive for myalgias.  Skin:  Negative for rash.  Neurological:  Positive for headaches. Negative for dizziness and weakness.    Physical Exam Triage Vital Signs ED Triage Vitals [11/12/20 0947]  Enc Vitals Group     BP (!) 149/91     Pulse Rate 89     Resp 18     Temp 98.7 F (37.1 C)     Temp Source Oral     SpO2 99 %     Weight      Height      Head Circumference      Peak Flow      Pain Score 10     Pain Loc      Pain Edu?      Excl. in [place]?    No data found.  Updated Vital Signs BP (!) 149/91 (BP Location:  Left Arm)   Pulse 89   Temp 98.7 F (37.1 C) (Oral)   Resp 18   SpO2 99%   Visual Acuity Right Eye Distance:   Left Eye Distance:   Bilateral Distance:    Right Eye Near:   Left Eye Near:    Bilateral Near:     Physical Exam Vitals and nursing note reviewed.  Constitutional:      General: She is not in acute distress. HENT:     Head: Normocephalic.     Nose: Nose normal.     Mouth/Throat:     Mouth: Mucous membranes are moist.     Pharynx: Oropharynx is clear. Uvula midline. No pharyngeal swelling, oropharyngeal exudate, posterior oropharyngeal erythema or uvula swelling.     Tonsils: No tonsillar exudate.  Eyes:     Conjunctiva/sclera: Conjunctivae normal.     Pupils: Pupils are equal, round, and reactive to light.  Cardiovascular:     Rate and Rhythm: Normal rate and regular rhythm.     Heart sounds: Normal heart sounds.  Pulmonary:     Effort: Pulmonary effort is normal. No respiratory distress.     Breath sounds: Normal breath sounds. No wheezing, rhonchi or rales.  Musculoskeletal:     Cervical back: Normal range of motion.  Lymphadenopathy:     Cervical: No cervical adenopathy.  Skin:    General: Skin is warm.     Findings: No rash.  Neurological:     Mental Status: She is  alert.     Gait: Gait normal.  Psychiatric:        Mood and Affect: Mood normal.     UC Treatments / Results  Labs (all labs ordered are listed, but only abnormal results are displayed) Labs Reviewed  COVID-19, FLU A+B NAA  POCT RAPID STREP A (OFFICE)    EKG   Radiology No results found.  Procedures Procedures (including critical care time)  Medications Ordered in UC Medications - No data to display  Initial Impression / Assessment and Plan / UC Course  I have reviewed the triage vital signs and the nursing notes.  Pertinent labs & imaging results that were available during my care of the patient were reviewed by me and considered in my medical decision making (see chart for details).     Sx consistent with COVID, known close exposure. R/o strep w neg rapid test. Two negative COVID rapid tests - will send out COVID and flu PCR. Sx tx and reassurance. Pt requested steroids to help with severe throat pain and declined viscous lidocaine mouthwash - will rx short course. Discussed ER precautions.   E/M: 1 acute illness with systemic symptoms, 2 data (strep, COVID/flu), moderate risk due to prescription management  Final Clinical Impressions(s) / UC Diagnoses   Final diagnoses:  Acute pharyngitis, unspecified etiology  Viral URI     Discharge Instructions      Strep test negative. COVID and flu tests sent to lab - these will result in [place]. Continue OTC medicine as needed. Take prednisone as prescribed to help with swallowing and cough medication as prescribed. Rest and keep hydrated. Recommend isolate until COVID results return.  Go to the ER if develop difficulty breathing.     ED Prescriptions     Medication Sig Dispense Auth. Provider   benzonatate (TESSALON) 100 MG capsule Take 1 capsule (100 mg total) by mouth every 8 (eight) hours. 21 capsule Abner Greenspan, Sueo Cullen L, PA   predniSONE (DELTASONE) 20 MG tablet Take  1 tablet (20 mg total) by mouth 2 (two) times daily  with a meal for 3 days. 6 tablet Abner Greenspan, Rhianon Zabawa L, PA      PDMP not reviewed this encounter.   Delsa Sale, [place] 11/12/20 1043

## 2020-11-12 NOTE — Telephone Encounter (Signed)
Call from pt regarding COVID test results- PCR sent out today. Per pt she did a home test this afternoon & test was positive. Pt works for [employer]- understands that COVID PCR results may not be back for 2-3 days. RN instructed pt to call [facility] regarding positive home test results for COVID. Patient verbalized an understanding.

## 2020-11-14 LAB — COVID-19, FLU A+B NAA
Influenza A, NAA: NOT DETECTED
Influenza B, NAA: NOT DETECTED
SARS-CoV-2, NAA: DETECTED — AB

## 2020-11-15 ENCOUNTER — Telehealth: Payer: Self-pay

## 2020-11-15 ENCOUNTER — Other Ambulatory Visit (HOSPITAL_BASED_OUTPATIENT_CLINIC_OR_DEPARTMENT_OTHER): Payer: Self-pay

## 2020-11-15 ENCOUNTER — Encounter: Payer: Self-pay | Admitting: Emergency Medicine

## 2020-11-15 ENCOUNTER — Telehealth: Payer: Self-pay | Admitting: Emergency Medicine

## 2020-11-15 DIAGNOSIS — U071 COVID-19: Secondary | ICD-10-CM

## 2020-11-15 MED ORDER — NIRMATRELVIR/RITONAVIR (PAXLOVID)TABLET
3.0000 | ORAL_TABLET | Freq: Two times a day (BID) | ORAL | 0 refills | Status: AC
  Filled 2020-11-15: qty 30, 5d supply, fill #0

## 2020-11-15 NOTE — Telephone Encounter (Signed)
Patient called stating she was seen on Friday, tested for COVID, test results came back yesterday as positive.  Patient began having COVID sx's Thursday night.  Right now patient states she feels ok still has a slight cough.  Patient would like to know if she can get the anti-viral medication?  Please advise.  [phone].

## 2020-11-15 NOTE — Telephone Encounter (Signed)
TC from pt requesting medication for recent Covid 19 diagnosis. PMH reviewed with Wandra Feinstein NP and RX written. PT advised of written RX via phone and need for pick up at [facility]. Pt verbalized understanding

## 2020-11-19 ENCOUNTER — Other Ambulatory Visit (HOSPITAL_BASED_OUTPATIENT_CLINIC_OR_DEPARTMENT_OTHER): Payer: Self-pay

## 2020-11-23 ENCOUNTER — Other Ambulatory Visit (HOSPITAL_BASED_OUTPATIENT_CLINIC_OR_DEPARTMENT_OTHER): Payer: Self-pay

## 2020-11-23 DIAGNOSIS — R059 Cough, unspecified: Secondary | ICD-10-CM | POA: Diagnosis not present

## 2020-11-23 MED ORDER — ALBUTEROL SULFATE HFA 108 (90 BASE) MCG/ACT IN AERS
INHALATION_SPRAY | RESPIRATORY_TRACT | 1 refills | Status: AC
  Filled 2020-11-23: qty 18, 25d supply, fill #0

## 2020-11-24 DIAGNOSIS — H524 Presbyopia: Secondary | ICD-10-CM | POA: Diagnosis not present

## 2020-11-24 DIAGNOSIS — H52223 Regular astigmatism, bilateral: Secondary | ICD-10-CM | POA: Diagnosis not present

## 2020-12-02 ENCOUNTER — Other Ambulatory Visit (HOSPITAL_BASED_OUTPATIENT_CLINIC_OR_DEPARTMENT_OTHER): Payer: Self-pay

## 2021-01-24 ENCOUNTER — Other Ambulatory Visit (HOSPITAL_BASED_OUTPATIENT_CLINIC_OR_DEPARTMENT_OTHER): Payer: Self-pay

## 2021-02-09 IMAGING — US US ABDOMEN LIMITED
1 series · 14 of 25 positions shown · non-contrast
Comparison: CT 01/21/2016

CLINICAL DATA: Nausea and right upper quadrant pain.

EXAM:
ULTRASOUND ABDOMEN LIMITED RIGHT UPPER QUADRANT

[Series 1: us abdomen limited · 14 of 38 slices shown]
[im 1/38]
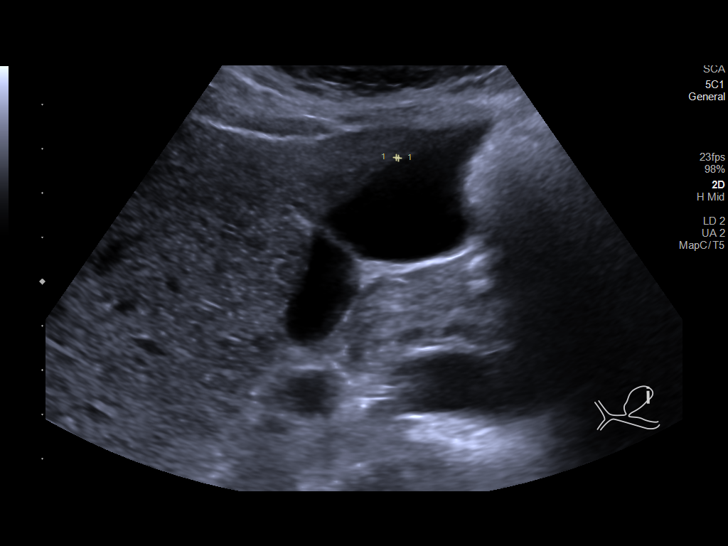
[im 4/38]
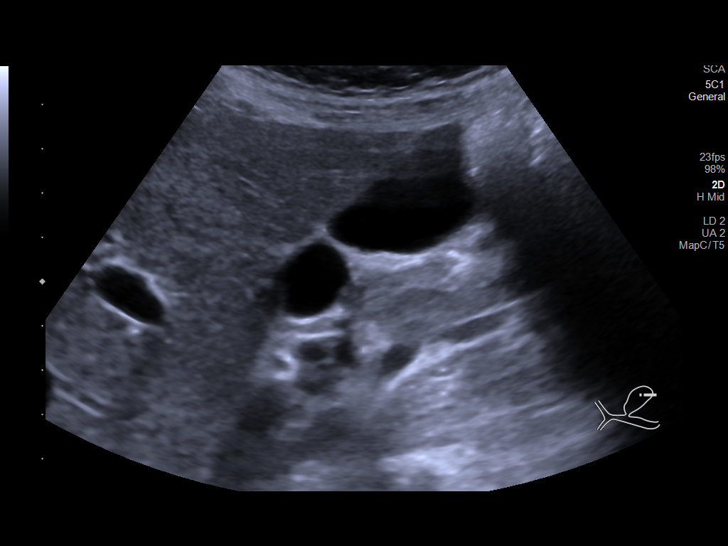
[im 7/38]
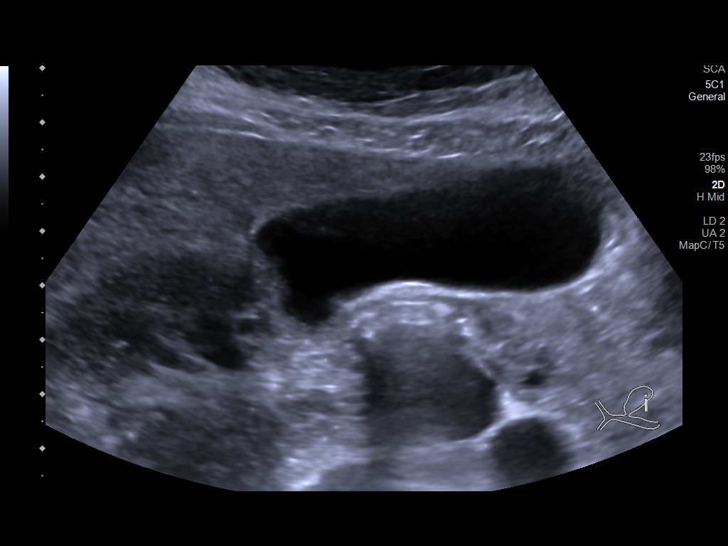
[im 10/38]
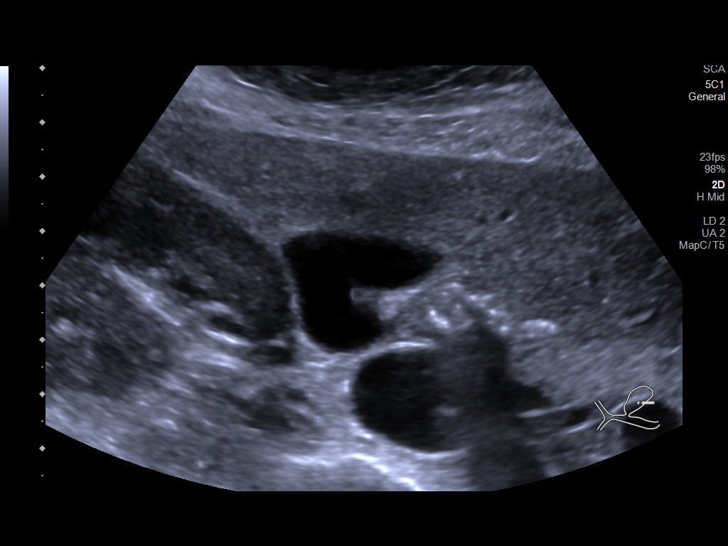
[im 13/38]
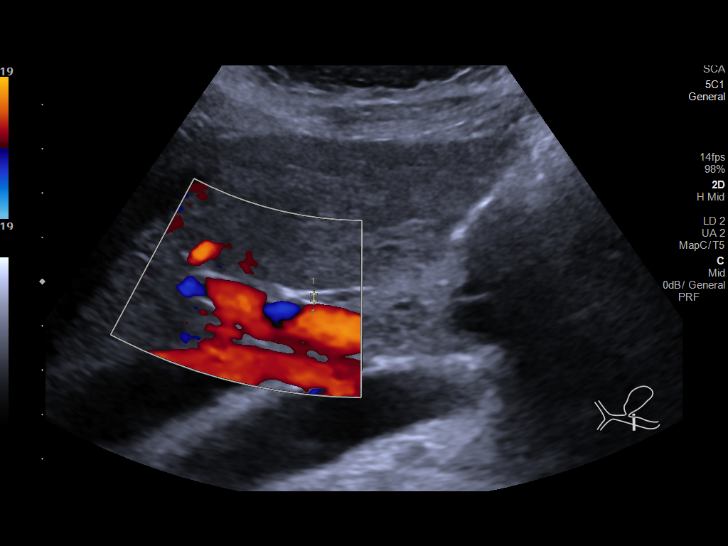
[im 14/38]
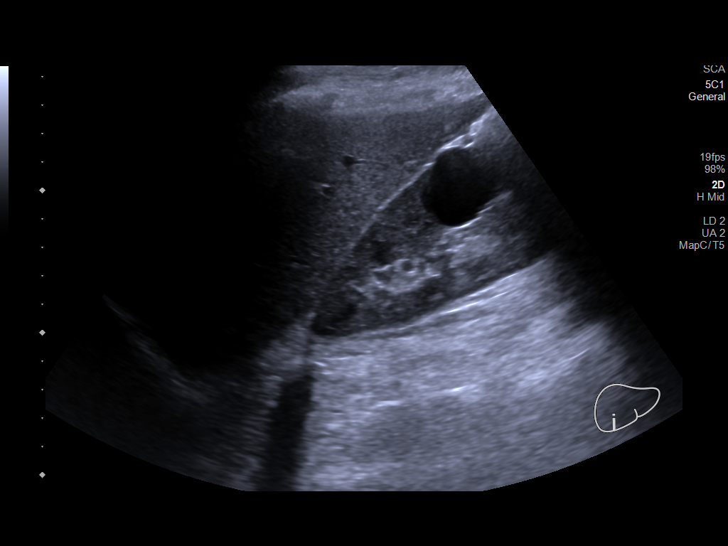
[im 17/38]
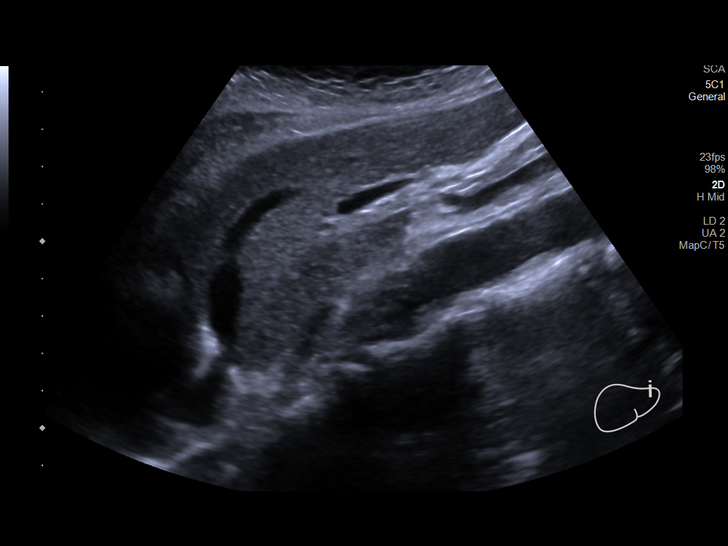
[im 21/38]
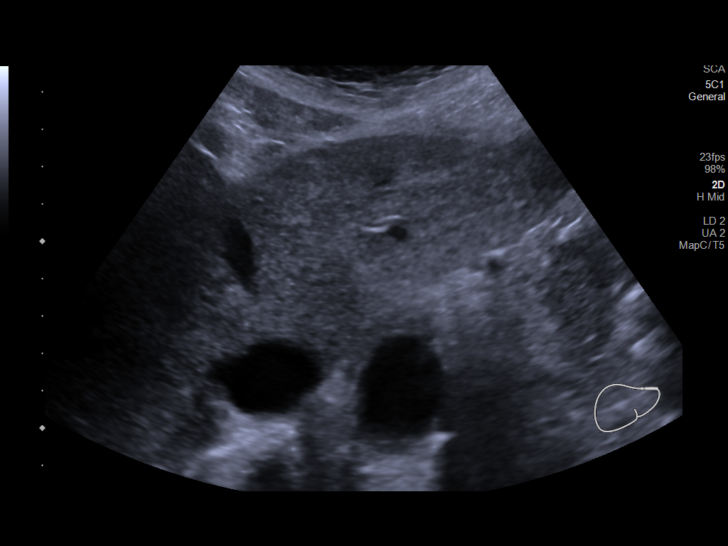
[im 24/38]
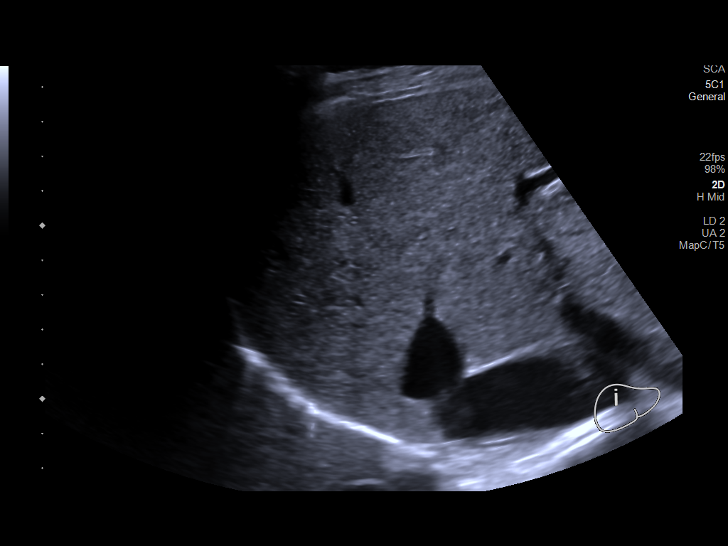
[im 25/38]
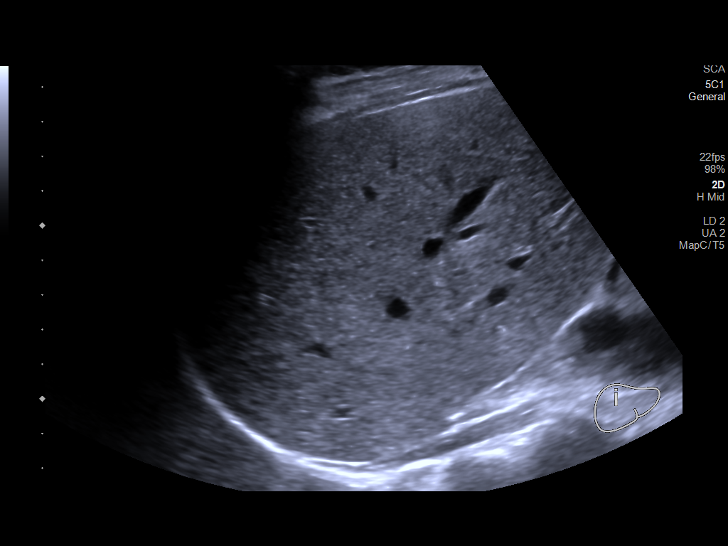
[im 28/38]
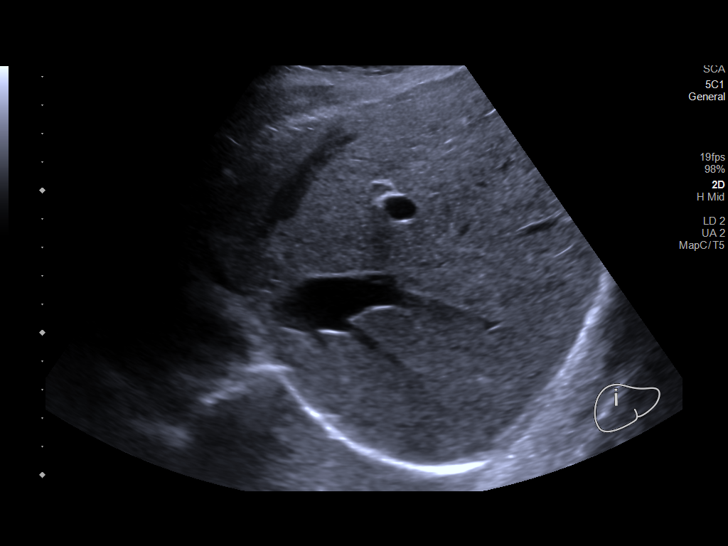
[im 31/38]
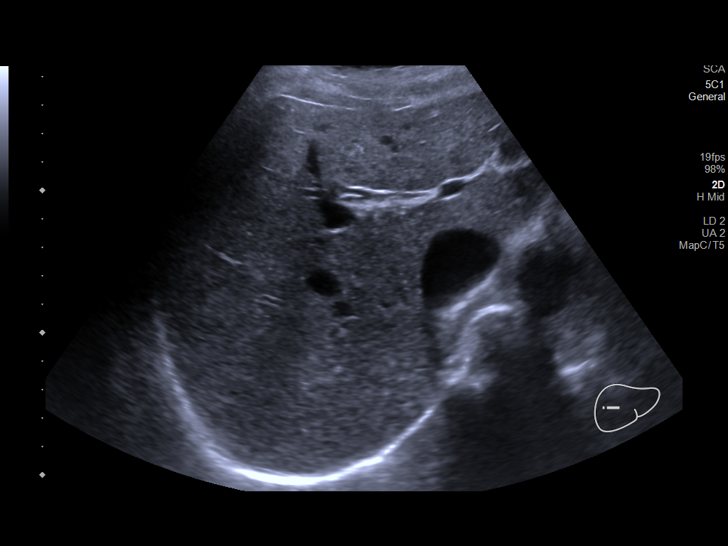
[im 34/38]
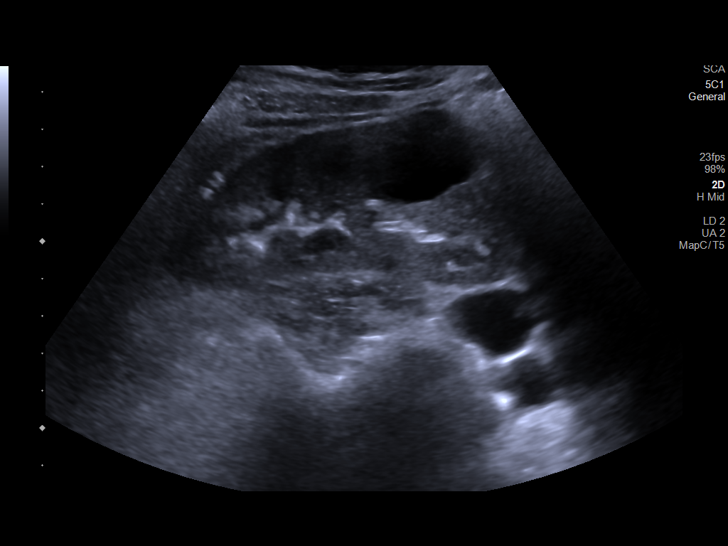
[im 38/38]
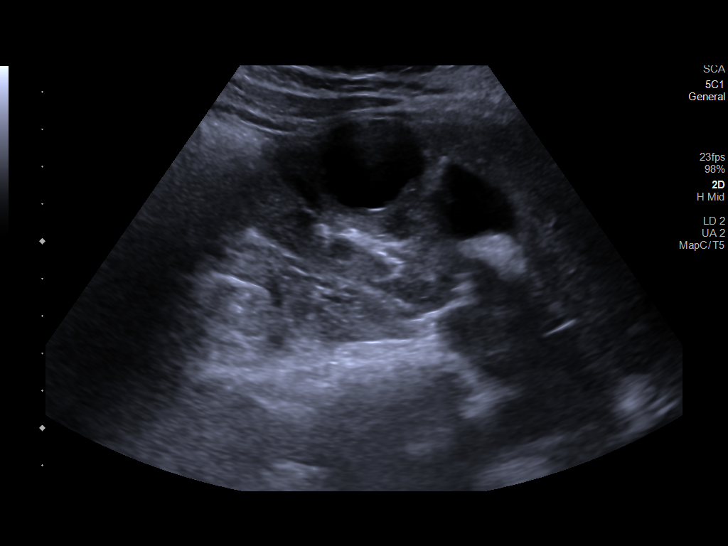

[14 of 25 positions shown; findings below may reference images not displayed]

FINDINGS: Gallbladder:

No gallstones or wall thickening visualized. No sonographic Murphy
sign noted by sonographer.

Common bile duct:

Diameter: 2 mm, normal

Liver:

No focal lesion identified. Within normal limits in parenchymal
echogenicity. Portal vein is patent on color Doppler imaging with
normal direction of blood flow towards the liver.

Other: Simple appearing cyst incidentally noted in the lower pole
right kidney, measuring 3.1 cm maximal dimension.
IMPRESSION: 1. No evidence of cholelithiasis or cholecystitis.
2. Incidental note of a simple appearing cyst in the lower pole
right kidney.

## 2021-02-11 ENCOUNTER — Other Ambulatory Visit (HOSPITAL_BASED_OUTPATIENT_CLINIC_OR_DEPARTMENT_OTHER): Payer: Self-pay

## 2021-02-11 MED ORDER — AMPHETAMINE-DEXTROAMPHETAMINE 20 MG PO TABS
20.0000 mg | ORAL_TABLET | Freq: Two times a day (BID) | ORAL | 0 refills | Status: DC
  Filled 2021-02-11 – 2021-02-22 (×2): qty 60, 30d supply, fill #0

## 2021-02-21 ENCOUNTER — Other Ambulatory Visit (HOSPITAL_BASED_OUTPATIENT_CLINIC_OR_DEPARTMENT_OTHER): Payer: Self-pay

## 2021-02-22 ENCOUNTER — Other Ambulatory Visit (HOSPITAL_BASED_OUTPATIENT_CLINIC_OR_DEPARTMENT_OTHER): Payer: Self-pay

## 2021-03-24 ENCOUNTER — Other Ambulatory Visit (HOSPITAL_BASED_OUTPATIENT_CLINIC_OR_DEPARTMENT_OTHER): Payer: Self-pay

## 2021-03-24 DIAGNOSIS — R87610 Atypical squamous cells of undetermined significance on cytologic smear of cervix (ASC-US): Secondary | ICD-10-CM | POA: Diagnosis not present

## 2021-03-24 DIAGNOSIS — Z124 Encounter for screening for malignant neoplasm of cervix: Secondary | ICD-10-CM | POA: Diagnosis not present

## 2021-03-24 DIAGNOSIS — Z01419 Encounter for gynecological examination (general) (routine) without abnormal findings: Secondary | ICD-10-CM | POA: Diagnosis not present

## 2021-03-24 DIAGNOSIS — Z1239 Encounter for other screening for malignant neoplasm of breast: Secondary | ICD-10-CM | POA: Diagnosis not present

## 2021-03-24 DIAGNOSIS — Z780 Asymptomatic menopausal state: Secondary | ICD-10-CM | POA: Diagnosis not present

## 2021-03-24 MED ORDER — ESTRADIOL 0.1 MG/GM VA CREA
1.0000 g | TOPICAL_CREAM | VAGINAL | 3 refills | Status: AC
  Filled 2021-03-24: qty 42.5, 84d supply, fill #0
  Filled 2021-06-21: qty 42.5, 84d supply, fill #1
  Filled 2021-08-24 – 2021-08-29 (×2): qty 42.5, 84d supply, fill #2

## 2021-03-25 ENCOUNTER — Other Ambulatory Visit (HOSPITAL_BASED_OUTPATIENT_CLINIC_OR_DEPARTMENT_OTHER): Payer: Self-pay

## 2021-03-25 MED ORDER — AMPHETAMINE-DEXTROAMPHETAMINE 20 MG PO TABS
ORAL_TABLET | ORAL | 0 refills | Status: DC
  Filled 2021-03-25: qty 60, 30d supply, fill #0

## 2021-03-28 ENCOUNTER — Other Ambulatory Visit (HOSPITAL_BASED_OUTPATIENT_CLINIC_OR_DEPARTMENT_OTHER): Payer: Self-pay

## 2021-05-24 DIAGNOSIS — Z86010 Personal history of colonic polyps: Secondary | ICD-10-CM | POA: Diagnosis not present

## 2021-05-24 DIAGNOSIS — Z1211 Encounter for screening for malignant neoplasm of colon: Secondary | ICD-10-CM | POA: Diagnosis not present

## 2021-05-25 ENCOUNTER — Other Ambulatory Visit (HOSPITAL_BASED_OUTPATIENT_CLINIC_OR_DEPARTMENT_OTHER): Payer: Self-pay

## 2021-05-25 MED ORDER — CLENPIQ 10-3.5-12 MG-GM -GM/160ML PO SOLN
ORAL | 0 refills | Status: AC
  Filled 2021-05-25 – 2021-06-21 (×2): qty 320, 1d supply, fill #0

## 2021-06-03 ENCOUNTER — Other Ambulatory Visit (HOSPITAL_BASED_OUTPATIENT_CLINIC_OR_DEPARTMENT_OTHER): Payer: Self-pay

## 2021-06-09 ENCOUNTER — Other Ambulatory Visit (HOSPITAL_BASED_OUTPATIENT_CLINIC_OR_DEPARTMENT_OTHER): Payer: Self-pay

## 2021-06-09 DIAGNOSIS — R41840 Attention and concentration deficit: Secondary | ICD-10-CM | POA: Diagnosis not present

## 2021-06-09 MED ORDER — AMPHETAMINE-DEXTROAMPHETAMINE 20 MG PO TABS
ORAL_TABLET | ORAL | 0 refills | Status: DC
  Filled 2021-06-09 – 2021-06-21 (×2): qty 60, 30d supply, fill #0

## 2021-06-10 DIAGNOSIS — H35413 Lattice degeneration of retina, bilateral: Secondary | ICD-10-CM | POA: Diagnosis not present

## 2021-06-10 DIAGNOSIS — H2513 Age-related nuclear cataract, bilateral: Secondary | ICD-10-CM | POA: Diagnosis not present

## 2021-06-10 DIAGNOSIS — H33332 Multiple defects of retina without detachment, left eye: Secondary | ICD-10-CM | POA: Diagnosis not present

## 2021-06-17 ENCOUNTER — Other Ambulatory Visit (HOSPITAL_BASED_OUTPATIENT_CLINIC_OR_DEPARTMENT_OTHER): Payer: Self-pay

## 2021-06-17 DIAGNOSIS — Q142 Congenital malformation of optic disc: Secondary | ICD-10-CM | POA: Diagnosis not present

## 2021-06-17 DIAGNOSIS — H43391 Other vitreous opacities, right eye: Secondary | ICD-10-CM | POA: Diagnosis not present

## 2021-06-17 DIAGNOSIS — H33323 Round hole, bilateral: Secondary | ICD-10-CM | POA: Diagnosis not present

## 2021-06-17 DIAGNOSIS — H35413 Lattice degeneration of retina, bilateral: Secondary | ICD-10-CM | POA: Diagnosis not present

## 2021-06-21 ENCOUNTER — Other Ambulatory Visit (HOSPITAL_BASED_OUTPATIENT_CLINIC_OR_DEPARTMENT_OTHER): Payer: Self-pay

## 2021-08-03 ENCOUNTER — Other Ambulatory Visit (HOSPITAL_BASED_OUTPATIENT_CLINIC_OR_DEPARTMENT_OTHER): Payer: Self-pay

## 2021-08-03 MED ORDER — TRETINOIN 0.1 % EX CREA
TOPICAL_CREAM | CUTANEOUS | 1 refills | Status: AC
  Filled 2021-08-03: qty 45, 30d supply, fill #0

## 2021-08-05 ENCOUNTER — Other Ambulatory Visit (HOSPITAL_BASED_OUTPATIENT_CLINIC_OR_DEPARTMENT_OTHER): Payer: Self-pay

## 2021-08-12 ENCOUNTER — Other Ambulatory Visit (HOSPITAL_BASED_OUTPATIENT_CLINIC_OR_DEPARTMENT_OTHER): Payer: Self-pay

## 2021-08-12 MED ORDER — ESTRADIOL 0.1 MG/GM VA CREA
TOPICAL_CREAM | VAGINAL | 3 refills | Status: AC
  Filled 2021-08-12: qty 42.5, 84d supply, fill #0
  Filled 2022-01-19: qty 42.5, 90d supply, fill #0
  Filled 2022-05-15: qty 42.5, 30d supply, fill #0
  Filled 2022-06-20 (×2): qty 42.5, 30d supply, fill #1

## 2021-08-24 ENCOUNTER — Other Ambulatory Visit (HOSPITAL_BASED_OUTPATIENT_CLINIC_OR_DEPARTMENT_OTHER): Payer: Self-pay

## 2021-08-29 ENCOUNTER — Other Ambulatory Visit (HOSPITAL_BASED_OUTPATIENT_CLINIC_OR_DEPARTMENT_OTHER): Payer: Self-pay

## 2021-09-08 ENCOUNTER — Other Ambulatory Visit (HOSPITAL_BASED_OUTPATIENT_CLINIC_OR_DEPARTMENT_OTHER): Payer: Self-pay

## 2021-09-08 MED ORDER — AMPHETAMINE-DEXTROAMPHETAMINE 20 MG PO TABS
ORAL_TABLET | ORAL | 0 refills | Status: AC
  Filled 2021-09-08: qty 60, 30d supply, fill #0

## 2021-10-13 ENCOUNTER — Other Ambulatory Visit (HOSPITAL_BASED_OUTPATIENT_CLINIC_OR_DEPARTMENT_OTHER): Payer: Self-pay

## 2021-10-13 DIAGNOSIS — N951 Menopausal and female climacteric states: Secondary | ICD-10-CM | POA: Diagnosis not present

## 2021-10-13 DIAGNOSIS — Z780 Asymptomatic menopausal state: Secondary | ICD-10-CM | POA: Diagnosis not present

## 2021-10-13 DIAGNOSIS — N952 Postmenopausal atrophic vaginitis: Secondary | ICD-10-CM | POA: Diagnosis not present

## 2021-10-13 MED ORDER — PROGESTERONE MICRONIZED 100 MG PO CAPS
ORAL_CAPSULE | ORAL | 3 refills | Status: DC
  Filled 2021-10-13 – 2022-01-19 (×2): qty 90, 90d supply, fill #0
  Filled 2022-01-19: qty 90, 90d supply, fill #1
  Filled 2022-05-15: qty 90, 90d supply, fill #0
  Filled 2022-09-06 – 2022-09-20 (×2): qty 90, 90d supply, fill #1

## 2021-10-13 MED ORDER — ESTRADIOL 0.1 MG/GM VA CREA
TOPICAL_CREAM | VAGINAL | 3 refills | Status: DC
  Filled 2021-10-13: qty 42.5, 84d supply, fill #0
  Filled 2022-01-19: qty 42.5, 90d supply, fill #0
  Filled 2022-03-15 – 2022-09-04 (×2): qty 42.5, 90d supply, fill #1

## 2021-11-03 ENCOUNTER — Other Ambulatory Visit (HOSPITAL_BASED_OUTPATIENT_CLINIC_OR_DEPARTMENT_OTHER): Payer: Self-pay

## 2021-12-07 ENCOUNTER — Other Ambulatory Visit (HOSPITAL_BASED_OUTPATIENT_CLINIC_OR_DEPARTMENT_OTHER): Payer: Self-pay

## 2021-12-07 MED ORDER — TRETINOIN 0.1 % EX CREA
TOPICAL_CREAM | CUTANEOUS | 1 refills | Status: AC
  Filled 2021-12-07: qty 45, 30d supply, fill #0
  Filled 2022-01-19 – 2022-02-01 (×2): qty 45, 30d supply, fill #1

## 2021-12-08 ENCOUNTER — Other Ambulatory Visit (HOSPITAL_BASED_OUTPATIENT_CLINIC_OR_DEPARTMENT_OTHER): Payer: Self-pay

## 2021-12-09 ENCOUNTER — Other Ambulatory Visit (HOSPITAL_BASED_OUTPATIENT_CLINIC_OR_DEPARTMENT_OTHER): Payer: Self-pay

## 2021-12-12 ENCOUNTER — Other Ambulatory Visit (HOSPITAL_BASED_OUTPATIENT_CLINIC_OR_DEPARTMENT_OTHER): Payer: Self-pay

## 2021-12-13 ENCOUNTER — Other Ambulatory Visit (HOSPITAL_BASED_OUTPATIENT_CLINIC_OR_DEPARTMENT_OTHER): Payer: Self-pay

## 2021-12-19 ENCOUNTER — Other Ambulatory Visit (HOSPITAL_BASED_OUTPATIENT_CLINIC_OR_DEPARTMENT_OTHER): Payer: Self-pay

## 2021-12-21 ENCOUNTER — Other Ambulatory Visit (HOSPITAL_BASED_OUTPATIENT_CLINIC_OR_DEPARTMENT_OTHER): Payer: Self-pay

## 2021-12-23 ENCOUNTER — Other Ambulatory Visit (HOSPITAL_BASED_OUTPATIENT_CLINIC_OR_DEPARTMENT_OTHER): Payer: Self-pay

## 2021-12-23 DIAGNOSIS — Z131 Encounter for screening for diabetes mellitus: Secondary | ICD-10-CM | POA: Diagnosis not present

## 2021-12-23 DIAGNOSIS — Z130 Encounter for screening for diseases of the blood and blood-forming organs and certain disorders involving the immune mechanism: Secondary | ICD-10-CM | POA: Diagnosis not present

## 2021-12-23 DIAGNOSIS — L709 Acne, unspecified: Secondary | ICD-10-CM | POA: Diagnosis not present

## 2021-12-23 DIAGNOSIS — Z0000 Encounter for general adult medical examination without abnormal findings: Secondary | ICD-10-CM | POA: Diagnosis not present

## 2021-12-23 DIAGNOSIS — Z13220 Encounter for screening for lipoid disorders: Secondary | ICD-10-CM | POA: Diagnosis not present

## 2021-12-23 DIAGNOSIS — Z13228 Encounter for screening for other metabolic disorders: Secondary | ICD-10-CM | POA: Diagnosis not present

## 2021-12-23 DIAGNOSIS — B001 Herpesviral vesicular dermatitis: Secondary | ICD-10-CM | POA: Diagnosis not present

## 2021-12-23 DIAGNOSIS — Z1329 Encounter for screening for other suspected endocrine disorder: Secondary | ICD-10-CM | POA: Diagnosis not present

## 2021-12-23 MED ORDER — VALACYCLOVIR HCL 500 MG PO TABS
500.0000 mg | ORAL_TABLET | Freq: Two times a day (BID) | ORAL | 5 refills | Status: DC
  Filled 2021-12-23 – 2022-01-19 (×3): qty 60, 30d supply, fill #0
  Filled 2022-03-15: qty 60, 30d supply, fill #1
  Filled 2022-03-20: qty 60, 30d supply, fill #0
  Filled 2022-05-15: qty 60, 30d supply, fill #1
  Filled 2022-09-04: qty 60, 30d supply, fill #2
  Filled 2022-11-28: qty 60, 30d supply, fill #3

## 2022-01-02 ENCOUNTER — Other Ambulatory Visit (HOSPITAL_BASED_OUTPATIENT_CLINIC_OR_DEPARTMENT_OTHER): Payer: Self-pay

## 2022-01-19 ENCOUNTER — Other Ambulatory Visit (HOSPITAL_BASED_OUTPATIENT_CLINIC_OR_DEPARTMENT_OTHER): Payer: Self-pay

## 2022-01-19 ENCOUNTER — Other Ambulatory Visit (HOSPITAL_COMMUNITY): Payer: Self-pay

## 2022-01-20 ENCOUNTER — Other Ambulatory Visit (HOSPITAL_COMMUNITY): Payer: Self-pay

## 2022-01-26 ENCOUNTER — Other Ambulatory Visit (HOSPITAL_BASED_OUTPATIENT_CLINIC_OR_DEPARTMENT_OTHER): Payer: Self-pay

## 2022-01-26 MED ORDER — ESTRADIOL 0.5 MG PO TABS
ORAL_TABLET | ORAL | 11 refills | Status: DC
  Filled 2022-01-26: qty 30, 30d supply, fill #0
  Filled 2022-03-15 – 2022-03-20 (×2): qty 30, 30d supply, fill #1
  Filled 2022-05-15: qty 30, 30d supply, fill #2
  Filled 2022-06-20 (×2): qty 30, 30d supply, fill #3
  Filled 2022-09-04: qty 30, 30d supply, fill #4
  Filled 2022-09-28: qty 30, 30d supply, fill #5
  Filled 2022-10-30 – 2023-01-22 (×5): qty 30, 30d supply, fill #6

## 2022-02-01 ENCOUNTER — Other Ambulatory Visit (HOSPITAL_BASED_OUTPATIENT_CLINIC_OR_DEPARTMENT_OTHER): Payer: Self-pay

## 2022-02-02 ENCOUNTER — Other Ambulatory Visit (HOSPITAL_BASED_OUTPATIENT_CLINIC_OR_DEPARTMENT_OTHER): Payer: Self-pay

## 2022-02-09 ENCOUNTER — Other Ambulatory Visit (HOSPITAL_BASED_OUTPATIENT_CLINIC_OR_DEPARTMENT_OTHER): Payer: Self-pay

## 2022-02-09 DIAGNOSIS — L709 Acne, unspecified: Secondary | ICD-10-CM | POA: Diagnosis not present

## 2022-02-09 DIAGNOSIS — F419 Anxiety disorder, unspecified: Secondary | ICD-10-CM | POA: Diagnosis not present

## 2022-02-09 MED ORDER — CLONAZEPAM 0.5 MG PO TABS
0.5000 mg | ORAL_TABLET | Freq: Two times a day (BID) | ORAL | 0 refills | Status: AC | PRN
  Filled 2022-02-09: qty 60, 30d supply, fill #0

## 2022-02-09 MED ORDER — TRETINOIN 0.1 % EX CREA
TOPICAL_CREAM | CUTANEOUS | 1 refills | Status: DC
  Filled 2022-02-09 – 2022-03-20 (×2): qty 45, 30d supply, fill #0
  Filled 2022-04-10 – 2022-05-15 (×3): qty 45, 30d supply, fill #1

## 2022-02-14 ENCOUNTER — Other Ambulatory Visit (HOSPITAL_BASED_OUTPATIENT_CLINIC_OR_DEPARTMENT_OTHER): Payer: Self-pay

## 2022-02-14 DIAGNOSIS — F419 Anxiety disorder, unspecified: Secondary | ICD-10-CM | POA: Diagnosis not present

## 2022-02-14 MED ORDER — AMPHETAMINE-DEXTROAMPHETAMINE 20 MG PO TABS
20.0000 mg | ORAL_TABLET | Freq: Two times a day (BID) | ORAL | 0 refills | Status: AC
  Filled 2022-02-14: qty 60, 30d supply, fill #0

## 2022-02-17 ENCOUNTER — Other Ambulatory Visit (HOSPITAL_BASED_OUTPATIENT_CLINIC_OR_DEPARTMENT_OTHER): Payer: Self-pay

## 2022-02-17 MED ORDER — FLUARIX QUADRIVALENT 0.5 ML IM SUSY
PREFILLED_SYRINGE | INTRAMUSCULAR | 0 refills | Status: AC
  Filled 2022-02-17: qty 0.5, 1d supply, fill #0

## 2022-02-23 DIAGNOSIS — F32A Depression, unspecified: Secondary | ICD-10-CM | POA: Diagnosis not present

## 2022-03-09 ENCOUNTER — Other Ambulatory Visit (HOSPITAL_BASED_OUTPATIENT_CLINIC_OR_DEPARTMENT_OTHER): Payer: Self-pay

## 2022-03-09 DIAGNOSIS — F419 Anxiety disorder, unspecified: Secondary | ICD-10-CM | POA: Diagnosis not present

## 2022-03-09 DIAGNOSIS — F321 Major depressive disorder, single episode, moderate: Secondary | ICD-10-CM | POA: Diagnosis not present

## 2022-03-09 MED ORDER — CITALOPRAM HYDROBROMIDE 20 MG PO TABS
20.0000 mg | ORAL_TABLET | Freq: Every day | ORAL | 1 refills | Status: AC
  Filled 2022-03-09 – 2022-03-20 (×2): qty 30, 30d supply, fill #0
  Filled 2022-09-06 – 2022-09-20 (×2): qty 30, 30d supply, fill #1

## 2022-03-15 ENCOUNTER — Other Ambulatory Visit (HOSPITAL_COMMUNITY): Payer: Self-pay

## 2022-03-17 ENCOUNTER — Other Ambulatory Visit (HOSPITAL_BASED_OUTPATIENT_CLINIC_OR_DEPARTMENT_OTHER): Payer: Self-pay

## 2022-03-20 ENCOUNTER — Other Ambulatory Visit (HOSPITAL_COMMUNITY): Payer: Self-pay

## 2022-03-20 ENCOUNTER — Other Ambulatory Visit (HOSPITAL_BASED_OUTPATIENT_CLINIC_OR_DEPARTMENT_OTHER): Payer: Self-pay

## 2022-03-21 ENCOUNTER — Other Ambulatory Visit (HOSPITAL_BASED_OUTPATIENT_CLINIC_OR_DEPARTMENT_OTHER): Payer: Self-pay

## 2022-03-23 DIAGNOSIS — F419 Anxiety disorder, unspecified: Secondary | ICD-10-CM | POA: Diagnosis not present

## 2022-03-23 DIAGNOSIS — F32A Depression, unspecified: Secondary | ICD-10-CM | POA: Diagnosis not present

## 2022-04-10 ENCOUNTER — Other Ambulatory Visit (HOSPITAL_BASED_OUTPATIENT_CLINIC_OR_DEPARTMENT_OTHER): Payer: Self-pay

## 2022-05-01 ENCOUNTER — Other Ambulatory Visit (HOSPITAL_BASED_OUTPATIENT_CLINIC_OR_DEPARTMENT_OTHER): Payer: Self-pay

## 2022-05-03 ENCOUNTER — Other Ambulatory Visit (HOSPITAL_BASED_OUTPATIENT_CLINIC_OR_DEPARTMENT_OTHER): Payer: Self-pay

## 2022-05-03 DIAGNOSIS — F419 Anxiety disorder, unspecified: Secondary | ICD-10-CM | POA: Diagnosis not present

## 2022-05-03 DIAGNOSIS — F321 Major depressive disorder, single episode, moderate: Secondary | ICD-10-CM | POA: Diagnosis not present

## 2022-05-03 MED ORDER — CITALOPRAM HYDROBROMIDE 20 MG PO TABS
20.0000 mg | ORAL_TABLET | Freq: Every day | ORAL | 2 refills | Status: AC
  Filled 2022-05-03 – 2022-05-15 (×2): qty 30, 30d supply, fill #0
  Filled 2022-07-12: qty 30, 30d supply, fill #1
  Filled 2022-10-27 – 2023-01-22 (×4): qty 30, 30d supply, fill #2

## 2022-05-10 ENCOUNTER — Other Ambulatory Visit (HOSPITAL_BASED_OUTPATIENT_CLINIC_OR_DEPARTMENT_OTHER): Payer: Self-pay

## 2022-05-15 ENCOUNTER — Other Ambulatory Visit (HOSPITAL_BASED_OUTPATIENT_CLINIC_OR_DEPARTMENT_OTHER): Payer: Self-pay

## 2022-05-23 ENCOUNTER — Other Ambulatory Visit (HOSPITAL_BASED_OUTPATIENT_CLINIC_OR_DEPARTMENT_OTHER): Payer: Self-pay

## 2022-06-16 ENCOUNTER — Other Ambulatory Visit (HOSPITAL_BASED_OUTPATIENT_CLINIC_OR_DEPARTMENT_OTHER): Payer: Self-pay

## 2022-06-20 ENCOUNTER — Other Ambulatory Visit (HOSPITAL_BASED_OUTPATIENT_CLINIC_OR_DEPARTMENT_OTHER): Payer: Self-pay

## 2022-06-20 MED ORDER — SODIUM FLUORIDE 1.1 % DT GEL
DENTAL | 3 refills | Status: AC
  Filled 2022-06-20 (×3): qty 100, 30d supply, fill #0
  Filled 2022-09-04: qty 100, 30d supply, fill #1
  Filled 2022-09-28: qty 100, 30d supply, fill #2
  Filled 2023-06-11: qty 100, 30d supply, fill #3

## 2022-06-20 MED ORDER — TRETINOIN 0.1 % EX CREA
TOPICAL_CREAM | CUTANEOUS | 1 refills | Status: DC
  Filled 2022-06-20: qty 45, 30d supply, fill #0
  Filled 2022-09-06 – 2022-10-05 (×3): qty 45, 30d supply, fill #1

## 2022-06-21 ENCOUNTER — Other Ambulatory Visit (HOSPITAL_BASED_OUTPATIENT_CLINIC_OR_DEPARTMENT_OTHER): Payer: Self-pay

## 2022-06-28 ENCOUNTER — Other Ambulatory Visit: Payer: Self-pay

## 2022-06-28 ENCOUNTER — Other Ambulatory Visit (HOSPITAL_BASED_OUTPATIENT_CLINIC_OR_DEPARTMENT_OTHER): Payer: Self-pay

## 2022-06-28 MED ORDER — AMPHETAMINE-DEXTROAMPHETAMINE 20 MG PO TABS
20.0000 mg | ORAL_TABLET | Freq: Two times a day (BID) | ORAL | 0 refills | Status: DC
  Filled 2022-06-28 – 2022-07-12 (×2): qty 60, 30d supply, fill #0

## 2022-06-28 MED ORDER — TRETINOIN 0.1 % EX CREA
1.0000 | TOPICAL_CREAM | Freq: Every evening | CUTANEOUS | 1 refills | Status: DC
  Filled 2022-08-07: qty 45, 30d supply, fill #0
  Filled 2022-09-04: qty 45, 30d supply, fill #1

## 2022-07-06 ENCOUNTER — Other Ambulatory Visit (HOSPITAL_BASED_OUTPATIENT_CLINIC_OR_DEPARTMENT_OTHER): Payer: Self-pay

## 2022-07-07 ENCOUNTER — Other Ambulatory Visit: Payer: Self-pay

## 2022-07-12 ENCOUNTER — Other Ambulatory Visit (HOSPITAL_BASED_OUTPATIENT_CLINIC_OR_DEPARTMENT_OTHER): Payer: Self-pay

## 2022-08-01 ENCOUNTER — Other Ambulatory Visit (HOSPITAL_BASED_OUTPATIENT_CLINIC_OR_DEPARTMENT_OTHER): Payer: Self-pay

## 2022-08-01 DIAGNOSIS — L819 Disorder of pigmentation, unspecified: Secondary | ICD-10-CM | POA: Diagnosis not present

## 2022-08-01 DIAGNOSIS — F419 Anxiety disorder, unspecified: Secondary | ICD-10-CM | POA: Diagnosis not present

## 2022-08-01 MED ORDER — HYDROQUINONE 4 % EX CREA
1.0000 | TOPICAL_CREAM | Freq: Two times a day (BID) | CUTANEOUS | 1 refills | Status: DC
  Filled 2022-08-01: qty 28.35, 30d supply, fill #0
  Filled 2022-09-06: qty 28.35, 30d supply, fill #1
  Filled 2022-09-20: qty 56.7, 60d supply, fill #1
  Filled 2022-11-28: qty 28.35, 30d supply, fill #2

## 2022-08-07 ENCOUNTER — Other Ambulatory Visit (HOSPITAL_BASED_OUTPATIENT_CLINIC_OR_DEPARTMENT_OTHER): Payer: Self-pay

## 2022-08-08 DIAGNOSIS — F32A Depression, unspecified: Secondary | ICD-10-CM | POA: Diagnosis not present

## 2022-08-08 DIAGNOSIS — Z1211 Encounter for screening for malignant neoplasm of colon: Secondary | ICD-10-CM | POA: Diagnosis not present

## 2022-08-08 DIAGNOSIS — Z86010 Personal history of colonic polyps: Secondary | ICD-10-CM | POA: Diagnosis not present

## 2022-08-08 DIAGNOSIS — F909 Attention-deficit hyperactivity disorder, unspecified type: Secondary | ICD-10-CM | POA: Diagnosis not present

## 2022-08-11 ENCOUNTER — Ambulatory Visit: Payer: Commercial Managed Care - PPO | Admitting: Podiatry

## 2022-08-11 DIAGNOSIS — M205X2 Other deformities of toe(s) (acquired), left foot: Secondary | ICD-10-CM | POA: Diagnosis not present

## 2022-08-11 DIAGNOSIS — L84 Corns and callosities: Secondary | ICD-10-CM

## 2022-08-11 NOTE — Progress Notes (Signed)
  Subjective:  Patient ID: Jocelyn Howell, female    DOBMichel [id number],  MRN: [id number]  Chief Complaint  Patient presents with   Callouses    Located on bilateral 5th digits     62 y.o. female presents with concern for pain on the bilateral fifth toe on the outside.  She says she has corns and calluses located there as well as hammertoe deformity.  She has previously had trimmed down in the past.  She works on her feet and wears closed toe shoes at work which causes pain to the area.  [employer]  Past Medical History:  Diagnosis Date   ADD (attention deficit disorder)    Anemia    Dysrhythmia    irregular, not for 3 years   Fibroids    Heart murmur    years ago   PONV (postoperative nausea and vomiting)     Allergies  Allergen Reactions   Other     ROS: Negative except as per HPI above  Objective:  General: AAO x3, NAD  Dermatological: Ptotic lesion of the dorsal lateral aspect of the fifth toe proximal interphalangeal joint bilaterally left worse than right with pain on palpation of these lesions.  Vascular:  Dorsalis Pedis artery and Posterior Tibial artery pedal pulses are 2/4 bilateral.  Capillary fill time < 3 sec to all digits.   Neruologic: Grossly intact via light touch bilateral. Protective threshold intact to all sites bilateral.   Musculoskeletal: Fifth toe adductovarus rotation deformity noted bilaterally left worse than right.  Edema and pain with palpation of the fifth toe  Gait: Unassisted, Nonantalgic.   No images are attached to the encounter.  Radiographs:  Third Assessment:   1. 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  # Bilateral fifth digit hyperkeratotic tissue lesion consistent with corn related to adductovarus rotation of the fifth toe bilaterally All symptomatic hyperkeratoses x2 were safely debrided with a sterile #15 blade to  patient's level of comfort without incident. We discussed preventative and palliative care of these lesions including supportive and accommodative shoegear, padding, prefabricated and custom molded accommodative orthoses, use of a pumice stone and lotions/creams daily. -Discussed correction of fifth hammertoe deformity bilaterally -Patient would like to defer surgical intervention.  Recommend use of gel Padding   Return if symptoms worsen or fail to improve.          Corinna Gab, DPM [facility] / [facility]

## 2022-08-15 ENCOUNTER — Telehealth: Payer: Self-pay | Admitting: Podiatry

## 2022-08-15 NOTE — Telephone Encounter (Signed)
Pt called to complain of service of corn removal. And wanted the appointment waved for Friday 4/26 with Dr. Eloy End I inform the pt that she would be charged with another copay since its with a different Dr. Rock Nephew stated she wanted to speak with a Manager.   Spoke with Billing and they stated they could waive the first copay.  Please advise

## 2022-08-18 ENCOUNTER — Encounter: Payer: Self-pay | Admitting: Podiatry

## 2022-08-18 ENCOUNTER — Ambulatory Visit (INDEPENDENT_AMBULATORY_CARE_PROVIDER_SITE_OTHER): Payer: Commercial Managed Care - PPO | Admitting: Podiatry

## 2022-08-18 DIAGNOSIS — F32A Depression, unspecified: Secondary | ICD-10-CM | POA: Insufficient documentation

## 2022-08-18 DIAGNOSIS — M79674 Pain in right toe(s): Secondary | ICD-10-CM

## 2022-08-18 DIAGNOSIS — M79675 Pain in left toe(s): Secondary | ICD-10-CM

## 2022-08-18 DIAGNOSIS — L84 Corns and callosities: Secondary | ICD-10-CM

## 2022-08-20 NOTE — Progress Notes (Signed)
  Subjective:  Patient ID: Jocelyn Howell, female    DOB: [birth year],  MRN: [id number]  Chief Complaint  Patient presents with   Callouses    LEFT 5TH TOE PAIN POSSIBLE CORN, WAS SEEN ON FRIDAY IN OUR [place] LOCATION, WHEN SHE GOT HOME PUT ON INCLOSED SHOES THE 5TH TOE WAS STILL CAUSING HER SOME PAIN.   PCP is [place], [place], [place].  Allergies  Allergen Reactions   Other    Review of Systems: Negative except as noted in the HPI.  Objective: No changes noted in today's physical examination. There were no vitals filed for this visit.  Jocelyn Howell is a pleasant 62 y.o. female, WD, WN  in NAD. AAO x 3.  Vascular Examination: Capillary refill time <3 seconds b/l LE. Palpable pedal pulses b/l LE. Digital hair present b/l. No pedal edema b/l. Skin temperature gradient WNL b/l. No varicosities b/l.  Dermatological Examination: Pedal skin with normal turgor, texture and tone b/l. No open wounds. No interdigital macerations b/l.   Toenails 1-5 bilaterally well maintained with adequate length. No erythema, no edema, no drainage, no fluctuance.   Hyperkeratotic lesion(s) lateral nailfold b/l 5th digits consistent with Lister's corn and dorsal PIPJ of L 5th toe.  No erythema, no edema, no drainage, no fluctuance.  Neurological Examination: Protective sensation intact with 10 gram monofilament b/l LE. Vibratory sensation intact b/l LE.   Musculoskeletal Examination: Muscle strength 5/5 to all LE muscle groups b/l. 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  Assessment/Plan: 1. Corn of toe   2. Pain in toes of both feet     -Patient was evaluated and treated. All patient's and/or POA's questions/concerns answered on today's visit. -Examined patient. -Dispensed toe caps and tube foam for padding of 5th digits prn when wearing enclosed shoe gear. -As a courtesy, corn(s) dorsal PIPJ of L 5th toe and lateral nailfold bilateral 5th digits pared utilizing sterile scalpel blade  without complication or incident. Total number pared=3. -Patient/POA to call should there be question/concern in the interim.   Return in about 3 months (around 11/17/2022).  Freddie Breech, DPM

## 2022-09-04 ENCOUNTER — Other Ambulatory Visit: Payer: Self-pay

## 2022-09-04 ENCOUNTER — Other Ambulatory Visit (HOSPITAL_BASED_OUTPATIENT_CLINIC_OR_DEPARTMENT_OTHER): Payer: Self-pay

## 2022-09-04 MED ORDER — AMPHETAMINE-DEXTROAMPHETAMINE 20 MG PO TABS
20.0000 mg | ORAL_TABLET | Freq: Two times a day (BID) | ORAL | 0 refills | Status: AC
  Filled 2022-09-04: qty 60, 30d supply, fill #0

## 2022-09-05 ENCOUNTER — Other Ambulatory Visit (HOSPITAL_BASED_OUTPATIENT_CLINIC_OR_DEPARTMENT_OTHER): Payer: Self-pay

## 2022-09-06 ENCOUNTER — Other Ambulatory Visit (HOSPITAL_BASED_OUTPATIENT_CLINIC_OR_DEPARTMENT_OTHER): Payer: Self-pay

## 2022-09-06 ENCOUNTER — Other Ambulatory Visit: Payer: Self-pay

## 2022-09-07 ENCOUNTER — Other Ambulatory Visit (HOSPITAL_BASED_OUTPATIENT_CLINIC_OR_DEPARTMENT_OTHER): Payer: Self-pay

## 2022-09-19 ENCOUNTER — Other Ambulatory Visit (HOSPITAL_BASED_OUTPATIENT_CLINIC_OR_DEPARTMENT_OTHER): Payer: Self-pay

## 2022-09-20 ENCOUNTER — Other Ambulatory Visit (HOSPITAL_BASED_OUTPATIENT_CLINIC_OR_DEPARTMENT_OTHER): Payer: Self-pay

## 2022-09-21 ENCOUNTER — Other Ambulatory Visit (HOSPITAL_BASED_OUTPATIENT_CLINIC_OR_DEPARTMENT_OTHER): Payer: Self-pay

## 2022-09-28 ENCOUNTER — Other Ambulatory Visit: Payer: Self-pay

## 2022-09-28 ENCOUNTER — Other Ambulatory Visit (HOSPITAL_BASED_OUTPATIENT_CLINIC_OR_DEPARTMENT_OTHER): Payer: Self-pay

## 2022-09-29 ENCOUNTER — Other Ambulatory Visit (HOSPITAL_BASED_OUTPATIENT_CLINIC_OR_DEPARTMENT_OTHER): Payer: Self-pay

## 2022-10-04 ENCOUNTER — Other Ambulatory Visit (HOSPITAL_BASED_OUTPATIENT_CLINIC_OR_DEPARTMENT_OTHER): Payer: Self-pay

## 2022-10-04 MED ORDER — GOLYTELY 236 G PO SOLR
ORAL | 0 refills | Status: AC
  Filled 2022-10-04: qty 4000, 1d supply, fill #0

## 2022-10-05 ENCOUNTER — Other Ambulatory Visit (HOSPITAL_BASED_OUTPATIENT_CLINIC_OR_DEPARTMENT_OTHER): Payer: Self-pay

## 2022-10-06 ENCOUNTER — Other Ambulatory Visit (HOSPITAL_BASED_OUTPATIENT_CLINIC_OR_DEPARTMENT_OTHER): Payer: Self-pay

## 2022-10-13 DIAGNOSIS — Z9049 Acquired absence of other specified parts of digestive tract: Secondary | ICD-10-CM | POA: Diagnosis not present

## 2022-10-13 DIAGNOSIS — Z1211 Encounter for screening for malignant neoplasm of colon: Secondary | ICD-10-CM | POA: Diagnosis not present

## 2022-10-13 DIAGNOSIS — K635 Polyp of colon: Secondary | ICD-10-CM | POA: Diagnosis not present

## 2022-10-13 DIAGNOSIS — Z980 Intestinal bypass and anastomosis status: Secondary | ICD-10-CM | POA: Diagnosis not present

## 2022-10-13 DIAGNOSIS — K6389 Other specified diseases of intestine: Secondary | ICD-10-CM | POA: Diagnosis not present

## 2022-11-09 ENCOUNTER — Other Ambulatory Visit (HOSPITAL_BASED_OUTPATIENT_CLINIC_OR_DEPARTMENT_OTHER): Payer: Self-pay

## 2022-11-10 ENCOUNTER — Other Ambulatory Visit (HOSPITAL_BASED_OUTPATIENT_CLINIC_OR_DEPARTMENT_OTHER): Payer: Self-pay

## 2022-11-10 MED ORDER — TRETINOIN 0.1 % EX CREA
TOPICAL_CREAM | CUTANEOUS | 1 refills | Status: DC
  Filled 2022-11-10: qty 45, 30d supply, fill #0
  Filled 2023-01-22 (×2): qty 45, 30d supply, fill #1

## 2022-11-28 ENCOUNTER — Other Ambulatory Visit: Payer: Self-pay

## 2022-11-28 ENCOUNTER — Other Ambulatory Visit (HOSPITAL_BASED_OUTPATIENT_CLINIC_OR_DEPARTMENT_OTHER): Payer: Self-pay

## 2022-11-29 ENCOUNTER — Other Ambulatory Visit (HOSPITAL_BASED_OUTPATIENT_CLINIC_OR_DEPARTMENT_OTHER): Payer: Self-pay

## 2022-11-29 ENCOUNTER — Encounter (HOSPITAL_BASED_OUTPATIENT_CLINIC_OR_DEPARTMENT_OTHER): Payer: Self-pay

## 2022-12-22 ENCOUNTER — Other Ambulatory Visit (HOSPITAL_BASED_OUTPATIENT_CLINIC_OR_DEPARTMENT_OTHER): Payer: Self-pay

## 2022-12-22 DIAGNOSIS — Z23 Encounter for immunization: Secondary | ICD-10-CM | POA: Diagnosis not present

## 2022-12-22 DIAGNOSIS — Z0000 Encounter for general adult medical examination without abnormal findings: Secondary | ICD-10-CM | POA: Diagnosis not present

## 2022-12-22 DIAGNOSIS — R41840 Attention and concentration deficit: Secondary | ICD-10-CM | POA: Diagnosis not present

## 2022-12-22 MED ORDER — AMPHETAMINE-DEXTROAMPHETAMINE 20 MG PO TABS
20.0000 mg | ORAL_TABLET | Freq: Two times a day (BID) | ORAL | 0 refills | Status: DC
  Filled 2022-12-22: qty 60, 30d supply, fill #0

## 2023-01-05 DIAGNOSIS — J029 Acute pharyngitis, unspecified: Secondary | ICD-10-CM | POA: Diagnosis not present

## 2023-01-05 DIAGNOSIS — J060 Acute laryngopharyngitis: Secondary | ICD-10-CM | POA: Diagnosis not present

## 2023-01-05 DIAGNOSIS — Z20822 Contact with and (suspected) exposure to covid-19: Secondary | ICD-10-CM | POA: Diagnosis not present

## 2023-01-05 DIAGNOSIS — J040 Acute laryngitis: Secondary | ICD-10-CM | POA: Diagnosis not present

## 2023-01-11 DIAGNOSIS — J069 Acute upper respiratory infection, unspecified: Secondary | ICD-10-CM | POA: Diagnosis not present

## 2023-01-22 ENCOUNTER — Other Ambulatory Visit (HOSPITAL_BASED_OUTPATIENT_CLINIC_OR_DEPARTMENT_OTHER): Payer: Self-pay

## 2023-01-22 ENCOUNTER — Other Ambulatory Visit: Payer: Self-pay

## 2023-01-22 MED ORDER — VALACYCLOVIR HCL 500 MG PO TABS
500.0000 mg | ORAL_TABLET | Freq: Two times a day (BID) | ORAL | 5 refills | Status: DC
  Filled 2023-01-22: qty 60, 30d supply, fill #0
  Filled 2023-03-20 (×2): qty 60, 30d supply, fill #1
  Filled 2023-05-22 – 2023-06-11 (×2): qty 60, 30d supply, fill #2
  Filled 2023-08-29: qty 60, 30d supply, fill #3
  Filled 2023-08-31: qty 60, 30d supply, fill #0

## 2023-01-22 MED ORDER — AMPHETAMINE-DEXTROAMPHETAMINE 20 MG PO TABS
20.0000 mg | ORAL_TABLET | Freq: Two times a day (BID) | ORAL | 0 refills | Status: DC
  Filled 2023-01-23: qty 60, 30d supply, fill #0

## 2023-01-22 MED ORDER — HYDROQUINONE 4 % EX CREA
1.0000 | TOPICAL_CREAM | Freq: Two times a day (BID) | CUTANEOUS | 1 refills | Status: AC
  Filled 2023-01-22: qty 28.35, 30d supply, fill #0
  Filled 2023-03-20: qty 28.35, 30d supply, fill #1
  Filled 2023-05-22 – 2023-08-29 (×2): qty 28.35, 30d supply, fill #2
  Filled 2023-08-31: qty 28.35, 30d supply, fill #0

## 2023-01-23 ENCOUNTER — Other Ambulatory Visit (HOSPITAL_BASED_OUTPATIENT_CLINIC_OR_DEPARTMENT_OTHER): Payer: Self-pay

## 2023-01-23 ENCOUNTER — Other Ambulatory Visit: Payer: Self-pay

## 2023-01-24 ENCOUNTER — Other Ambulatory Visit (HOSPITAL_BASED_OUTPATIENT_CLINIC_OR_DEPARTMENT_OTHER): Payer: Self-pay

## 2023-01-24 MED ORDER — PROGESTERONE MICRONIZED 100 MG PO CAPS
100.0000 mg | ORAL_CAPSULE | Freq: Every evening | ORAL | 0 refills | Status: DC
  Filled 2023-01-24: qty 90, 90d supply, fill #0

## 2023-01-27 ENCOUNTER — Other Ambulatory Visit (HOSPITAL_COMMUNITY): Payer: Self-pay

## 2023-02-02 ENCOUNTER — Other Ambulatory Visit (HOSPITAL_BASED_OUTPATIENT_CLINIC_OR_DEPARTMENT_OTHER): Payer: Self-pay

## 2023-02-05 DIAGNOSIS — H43392 Other vitreous opacities, left eye: Secondary | ICD-10-CM | POA: Diagnosis not present

## 2023-02-05 DIAGNOSIS — H35413 Lattice degeneration of retina, bilateral: Secondary | ICD-10-CM | POA: Diagnosis not present

## 2023-02-05 DIAGNOSIS — H53142 Visual discomfort, left eye: Secondary | ICD-10-CM | POA: Diagnosis not present

## 2023-02-05 DIAGNOSIS — H33022 Retinal detachment with multiple breaks, left eye: Secondary | ICD-10-CM | POA: Diagnosis not present

## 2023-02-05 DIAGNOSIS — H43812 Vitreous degeneration, left eye: Secondary | ICD-10-CM | POA: Diagnosis not present

## 2023-02-07 DIAGNOSIS — H33022 Retinal detachment with multiple breaks, left eye: Secondary | ICD-10-CM | POA: Diagnosis not present

## 2023-02-12 ENCOUNTER — Other Ambulatory Visit (HOSPITAL_BASED_OUTPATIENT_CLINIC_OR_DEPARTMENT_OTHER): Payer: Self-pay

## 2023-02-12 DIAGNOSIS — Z124 Encounter for screening for malignant neoplasm of cervix: Secondary | ICD-10-CM | POA: Diagnosis not present

## 2023-02-12 DIAGNOSIS — Z780 Asymptomatic menopausal state: Secondary | ICD-10-CM | POA: Diagnosis not present

## 2023-02-12 DIAGNOSIS — Z01419 Encounter for gynecological examination (general) (routine) without abnormal findings: Secondary | ICD-10-CM | POA: Diagnosis not present

## 2023-02-12 MED ORDER — FLULAVAL 0.5 ML IM SUSY
0.5000 mL | PREFILLED_SYRINGE | Freq: Once | INTRAMUSCULAR | 0 refills | Status: AC
  Filled 2023-02-12: qty 0.5, 1d supply, fill #0

## 2023-02-12 MED ORDER — ESTRADIOL 0.1 MG/GM VA CREA
0.5000 g | TOPICAL_CREAM | VAGINAL | 2 refills | Status: AC
  Filled 2023-02-12 – 2023-02-23 (×2): qty 42.5, 90d supply, fill #0
  Filled 2023-05-22 – 2023-06-05 (×2): qty 42.5, 90d supply, fill #1
  Filled 2023-08-29: qty 42.5, 90d supply, fill #2
  Filled 2023-08-31: qty 42.5, 90d supply, fill #0

## 2023-02-12 MED ORDER — ESTRADIOL 0.5 MG PO TABS
0.5000 mg | ORAL_TABLET | Freq: Every day | ORAL | 11 refills | Status: DC
  Filled 2023-02-12 – 2023-02-23 (×3): qty 30, 30d supply, fill #0
  Filled 2023-03-20: qty 30, 30d supply, fill #1
  Filled 2023-05-22 – 2023-08-31 (×2): qty 30, 30d supply, fill #2
  Filled 2023-08-31: qty 30, 30d supply, fill #0
  Filled 2023-12-31: qty 30, 30d supply, fill #1
  Filled 2024-02-11: qty 30, 30d supply, fill #2

## 2023-02-12 MED ORDER — PROGESTERONE MICRONIZED 100 MG PO CAPS
100.0000 mg | ORAL_CAPSULE | Freq: Every day | ORAL | 4 refills | Status: AC
  Filled 2023-02-12 – 2023-06-11 (×3): qty 90, 90d supply, fill #0
  Filled 2023-08-29: qty 90, 90d supply, fill #1
  Filled 2023-08-31: qty 90, 90d supply, fill #0

## 2023-02-15 ENCOUNTER — Other Ambulatory Visit (HOSPITAL_BASED_OUTPATIENT_CLINIC_OR_DEPARTMENT_OTHER): Payer: Self-pay

## 2023-02-19 ENCOUNTER — Other Ambulatory Visit (HOSPITAL_BASED_OUTPATIENT_CLINIC_OR_DEPARTMENT_OTHER): Payer: Self-pay

## 2023-02-19 DIAGNOSIS — H33322 Round hole, left eye: Secondary | ICD-10-CM | POA: Diagnosis not present

## 2023-02-19 DIAGNOSIS — H43392 Other vitreous opacities, left eye: Secondary | ICD-10-CM | POA: Diagnosis not present

## 2023-02-19 DIAGNOSIS — G43109 Migraine with aura, not intractable, without status migrainosus: Secondary | ICD-10-CM | POA: Diagnosis not present

## 2023-02-22 ENCOUNTER — Other Ambulatory Visit (HOSPITAL_BASED_OUTPATIENT_CLINIC_OR_DEPARTMENT_OTHER): Payer: Self-pay

## 2023-02-23 ENCOUNTER — Other Ambulatory Visit (HOSPITAL_BASED_OUTPATIENT_CLINIC_OR_DEPARTMENT_OTHER): Payer: Self-pay

## 2023-02-23 ENCOUNTER — Other Ambulatory Visit (HOSPITAL_COMMUNITY): Payer: Self-pay

## 2023-02-23 MED ORDER — TRETINOIN 0.1 % EX CREA
TOPICAL_CREAM | CUTANEOUS | 1 refills | Status: AC
  Filled 2023-02-23: qty 45, 30d supply, fill #0
  Filled 2023-03-20: qty 45, 30d supply, fill #1

## 2023-02-26 ENCOUNTER — Other Ambulatory Visit: Payer: Self-pay

## 2023-03-07 DIAGNOSIS — H35411 Lattice degeneration of retina, right eye: Secondary | ICD-10-CM | POA: Diagnosis not present

## 2023-03-07 DIAGNOSIS — H59812 Chorioretinal scars after surgery for detachment, left eye: Secondary | ICD-10-CM | POA: Diagnosis not present

## 2023-03-20 ENCOUNTER — Other Ambulatory Visit (HOSPITAL_BASED_OUTPATIENT_CLINIC_OR_DEPARTMENT_OTHER): Payer: Self-pay

## 2023-03-20 MED ORDER — AMPHETAMINE-DEXTROAMPHETAMINE 20 MG PO TABS
20.0000 mg | ORAL_TABLET | Freq: Two times a day (BID) | ORAL | 0 refills | Status: AC
  Filled 2023-03-20: qty 60, 30d supply, fill #0

## 2023-05-22 ENCOUNTER — Other Ambulatory Visit: Payer: Self-pay

## 2023-05-22 ENCOUNTER — Other Ambulatory Visit (HOSPITAL_BASED_OUTPATIENT_CLINIC_OR_DEPARTMENT_OTHER): Payer: Self-pay

## 2023-05-22 MED ORDER — ESTRADIOL 0.1 MG/GM VA CREA
1.0000 g | TOPICAL_CREAM | VAGINAL | 3 refills | Status: AC
  Filled 2023-05-22: qty 42.5, 84d supply, fill #0
  Filled 2023-05-24: qty 42.5, 90d supply, fill #0
  Filled 2023-05-25: qty 42.5, 147d supply, fill #0
  Filled 2023-05-28: qty 42.5, 90d supply, fill #0
  Filled 2023-08-29: qty 42.5, 147d supply, fill #0

## 2023-05-24 ENCOUNTER — Other Ambulatory Visit (HOSPITAL_BASED_OUTPATIENT_CLINIC_OR_DEPARTMENT_OTHER): Payer: Self-pay

## 2023-05-25 ENCOUNTER — Other Ambulatory Visit (HOSPITAL_BASED_OUTPATIENT_CLINIC_OR_DEPARTMENT_OTHER): Payer: Self-pay

## 2023-05-28 ENCOUNTER — Other Ambulatory Visit (HOSPITAL_BASED_OUTPATIENT_CLINIC_OR_DEPARTMENT_OTHER): Payer: Self-pay

## 2023-06-04 ENCOUNTER — Other Ambulatory Visit (HOSPITAL_BASED_OUTPATIENT_CLINIC_OR_DEPARTMENT_OTHER): Payer: Self-pay

## 2023-06-11 ENCOUNTER — Other Ambulatory Visit (HOSPITAL_BASED_OUTPATIENT_CLINIC_OR_DEPARTMENT_OTHER): Payer: Self-pay

## 2023-06-11 MED ORDER — TRETINOIN 0.1 % EX CREA
1.0000 | TOPICAL_CREAM | Freq: Every evening | CUTANEOUS | 0 refills | Status: AC
  Filled 2023-06-11: qty 45, 30d supply, fill #0

## 2023-06-22 ENCOUNTER — Other Ambulatory Visit (HOSPITAL_COMMUNITY): Payer: Self-pay

## 2023-06-25 ENCOUNTER — Other Ambulatory Visit (HOSPITAL_BASED_OUTPATIENT_CLINIC_OR_DEPARTMENT_OTHER): Payer: Self-pay

## 2023-06-25 MED ORDER — SHINGRIX 50 MCG/0.5ML IM SUSR
0.5000 mL | Freq: Once | INTRAMUSCULAR | 1 refills | Status: AC
  Filled 2023-06-25: qty 0.5, 1d supply, fill #0

## 2023-08-09 DIAGNOSIS — Z780 Asymptomatic menopausal state: Secondary | ICD-10-CM | POA: Diagnosis not present

## 2023-08-09 DIAGNOSIS — Z719 Counseling, unspecified: Secondary | ICD-10-CM | POA: Diagnosis not present

## 2023-08-09 DIAGNOSIS — Z1231 Encounter for screening mammogram for malignant neoplasm of breast: Secondary | ICD-10-CM | POA: Diagnosis not present

## 2023-08-09 DIAGNOSIS — Z79890 Hormone replacement therapy (postmenopausal): Secondary | ICD-10-CM | POA: Diagnosis not present

## 2023-08-29 ENCOUNTER — Other Ambulatory Visit (HOSPITAL_BASED_OUTPATIENT_CLINIC_OR_DEPARTMENT_OTHER): Payer: Self-pay

## 2023-08-29 ENCOUNTER — Other Ambulatory Visit (HOSPITAL_COMMUNITY): Payer: Self-pay

## 2023-08-30 ENCOUNTER — Other Ambulatory Visit: Payer: Self-pay

## 2023-08-31 ENCOUNTER — Other Ambulatory Visit (HOSPITAL_BASED_OUTPATIENT_CLINIC_OR_DEPARTMENT_OTHER): Payer: Self-pay

## 2023-08-31 ENCOUNTER — Other Ambulatory Visit (HOSPITAL_COMMUNITY): Payer: Self-pay

## 2023-08-31 ENCOUNTER — Other Ambulatory Visit: Payer: Self-pay

## 2023-09-03 ENCOUNTER — Other Ambulatory Visit (HOSPITAL_COMMUNITY): Payer: Self-pay

## 2023-09-03 DIAGNOSIS — D239 Other benign neoplasm of skin, unspecified: Secondary | ICD-10-CM | POA: Diagnosis not present

## 2023-09-03 DIAGNOSIS — L821 Other seborrheic keratosis: Secondary | ICD-10-CM | POA: Diagnosis not present

## 2023-09-28 ENCOUNTER — Other Ambulatory Visit: Payer: Self-pay

## 2023-09-28 ENCOUNTER — Other Ambulatory Visit (HOSPITAL_BASED_OUTPATIENT_CLINIC_OR_DEPARTMENT_OTHER): Payer: Self-pay

## 2023-09-28 DIAGNOSIS — R41840 Attention and concentration deficit: Secondary | ICD-10-CM | POA: Diagnosis not present

## 2023-09-28 DIAGNOSIS — F419 Anxiety disorder, unspecified: Secondary | ICD-10-CM | POA: Diagnosis not present

## 2023-09-28 DIAGNOSIS — L819 Disorder of pigmentation, unspecified: Secondary | ICD-10-CM | POA: Diagnosis not present

## 2023-09-28 MED ORDER — AMPHETAMINE-DEXTROAMPHETAMINE 20 MG PO TABS
20.0000 mg | ORAL_TABLET | Freq: Two times a day (BID) | ORAL | 0 refills | Status: DC
  Filled 2023-09-28: qty 60, 30d supply, fill #0

## 2023-09-28 MED ORDER — TRETINOIN 0.1 % EX CREA
1.0000 | TOPICAL_CREAM | Freq: Every day | CUTANEOUS | 5 refills | Status: DC
  Filled 2023-09-28: qty 45, 30d supply, fill #0
  Filled 2023-10-25: qty 45, 30d supply, fill #1
  Filled 2023-11-24: qty 45, 30d supply, fill #2
  Filled 2023-12-24: qty 45, 30d supply, fill #3
  Filled 2024-01-19: qty 45, 30d supply, fill #4
  Filled 2024-02-18: qty 45, 30d supply, fill #5

## 2023-09-28 MED ORDER — CITALOPRAM HYDROBROMIDE 20 MG PO TABS
20.0000 mg | ORAL_TABLET | Freq: Every day | ORAL | 1 refills | Status: DC
  Filled 2023-09-28: qty 90, 90d supply, fill #0
  Filled 2023-12-24: qty 90, 90d supply, fill #1

## 2023-12-10 DIAGNOSIS — M542 Cervicalgia: Secondary | ICD-10-CM | POA: Diagnosis not present

## 2023-12-10 DIAGNOSIS — M25512 Pain in left shoulder: Secondary | ICD-10-CM | POA: Diagnosis not present

## 2023-12-10 DIAGNOSIS — R293 Abnormal posture: Secondary | ICD-10-CM | POA: Diagnosis not present

## 2023-12-25 ENCOUNTER — Other Ambulatory Visit (HOSPITAL_BASED_OUTPATIENT_CLINIC_OR_DEPARTMENT_OTHER): Payer: Self-pay

## 2023-12-25 DIAGNOSIS — Z0000 Encounter for general adult medical examination without abnormal findings: Secondary | ICD-10-CM | POA: Diagnosis not present

## 2023-12-25 DIAGNOSIS — R41840 Attention and concentration deficit: Secondary | ICD-10-CM | POA: Diagnosis not present

## 2023-12-25 MED ORDER — AMPHETAMINE-DEXTROAMPHETAMINE 20 MG PO TABS
20.0000 mg | ORAL_TABLET | Freq: Two times a day (BID) | ORAL | 0 refills | Status: DC
  Filled 2023-12-25: qty 60, 30d supply, fill #0

## 2023-12-26 DIAGNOSIS — R293 Abnormal posture: Secondary | ICD-10-CM | POA: Diagnosis not present

## 2023-12-26 DIAGNOSIS — M25512 Pain in left shoulder: Secondary | ICD-10-CM | POA: Diagnosis not present

## 2023-12-26 DIAGNOSIS — M542 Cervicalgia: Secondary | ICD-10-CM | POA: Diagnosis not present

## 2023-12-27 ENCOUNTER — Other Ambulatory Visit (HOSPITAL_BASED_OUTPATIENT_CLINIC_OR_DEPARTMENT_OTHER): Payer: Self-pay

## 2023-12-31 ENCOUNTER — Other Ambulatory Visit (HOSPITAL_COMMUNITY): Payer: Self-pay

## 2023-12-31 ENCOUNTER — Other Ambulatory Visit: Payer: Self-pay

## 2024-01-01 DIAGNOSIS — M25512 Pain in left shoulder: Secondary | ICD-10-CM | POA: Diagnosis not present

## 2024-01-01 DIAGNOSIS — M542 Cervicalgia: Secondary | ICD-10-CM | POA: Diagnosis not present

## 2024-01-01 DIAGNOSIS — R293 Abnormal posture: Secondary | ICD-10-CM | POA: Diagnosis not present

## 2024-01-07 DIAGNOSIS — R293 Abnormal posture: Secondary | ICD-10-CM | POA: Diagnosis not present

## 2024-01-07 DIAGNOSIS — M542 Cervicalgia: Secondary | ICD-10-CM | POA: Diagnosis not present

## 2024-01-07 DIAGNOSIS — M25512 Pain in left shoulder: Secondary | ICD-10-CM | POA: Diagnosis not present

## 2024-01-23 ENCOUNTER — Other Ambulatory Visit (HOSPITAL_BASED_OUTPATIENT_CLINIC_OR_DEPARTMENT_OTHER): Payer: Self-pay

## 2024-01-23 MED ORDER — FLUZONE 0.5 ML IM SUSY
0.5000 mL | PREFILLED_SYRINGE | Freq: Once | INTRAMUSCULAR | 0 refills | Status: AC
  Filled 2024-01-23: qty 0.5, 1d supply, fill #0

## 2024-02-11 ENCOUNTER — Other Ambulatory Visit (HOSPITAL_COMMUNITY): Payer: Self-pay

## 2024-02-11 ENCOUNTER — Other Ambulatory Visit: Payer: Self-pay

## 2024-02-11 MED ORDER — VALACYCLOVIR HCL 500 MG PO TABS
500.0000 mg | ORAL_TABLET | Freq: Two times a day (BID) | ORAL | 5 refills | Status: AC
  Filled 2024-02-11: qty 60, 30d supply, fill #0

## 2024-02-25 ENCOUNTER — Other Ambulatory Visit (HOSPITAL_BASED_OUTPATIENT_CLINIC_OR_DEPARTMENT_OTHER): Payer: Self-pay

## 2024-02-25 MED ORDER — FLUZONE 0.5 ML IM SUSY
0.5000 mL | PREFILLED_SYRINGE | Freq: Once | INTRAMUSCULAR | 0 refills | Status: DC
  Filled 2024-02-25: qty 0.5, 1d supply, fill #0

## 2024-02-25 MED ORDER — SHINGRIX 50 MCG/0.5ML IM SUSR
0.5000 mL | Freq: Once | INTRAMUSCULAR | 0 refills | Status: AC
  Filled 2024-02-25: qty 0.5, 1d supply, fill #0

## 2024-02-27 DIAGNOSIS — R293 Abnormal posture: Secondary | ICD-10-CM | POA: Diagnosis not present

## 2024-02-27 DIAGNOSIS — M25512 Pain in left shoulder: Secondary | ICD-10-CM | POA: Diagnosis not present

## 2024-02-27 DIAGNOSIS — M542 Cervicalgia: Secondary | ICD-10-CM | POA: Diagnosis not present

## 2024-02-29 ENCOUNTER — Other Ambulatory Visit (HOSPITAL_COMMUNITY): Payer: Self-pay

## 2024-02-29 ENCOUNTER — Other Ambulatory Visit (HOSPITAL_BASED_OUTPATIENT_CLINIC_OR_DEPARTMENT_OTHER): Payer: Self-pay

## 2024-03-03 ENCOUNTER — Other Ambulatory Visit (HOSPITAL_COMMUNITY): Payer: Self-pay

## 2024-03-03 ENCOUNTER — Other Ambulatory Visit (HOSPITAL_BASED_OUTPATIENT_CLINIC_OR_DEPARTMENT_OTHER): Payer: Self-pay

## 2024-03-05 ENCOUNTER — Other Ambulatory Visit (HOSPITAL_COMMUNITY): Payer: Self-pay

## 2024-03-24 ENCOUNTER — Other Ambulatory Visit (HOSPITAL_COMMUNITY): Payer: Self-pay

## 2024-03-24 MED ORDER — CITALOPRAM HYDROBROMIDE 20 MG PO TABS
20.0000 mg | ORAL_TABLET | Freq: Every day | ORAL | 0 refills | Status: AC
  Filled 2024-03-24: qty 90, 90d supply, fill #0

## 2024-03-25 ENCOUNTER — Other Ambulatory Visit: Payer: Self-pay

## 2024-03-25 ENCOUNTER — Other Ambulatory Visit (HOSPITAL_COMMUNITY): Payer: Self-pay

## 2024-03-26 ENCOUNTER — Encounter (HOSPITAL_COMMUNITY): Payer: Self-pay

## 2024-03-27 ENCOUNTER — Other Ambulatory Visit (HOSPITAL_COMMUNITY): Payer: Self-pay

## 2024-03-27 ENCOUNTER — Other Ambulatory Visit (HOSPITAL_BASED_OUTPATIENT_CLINIC_OR_DEPARTMENT_OTHER): Payer: Self-pay

## 2024-03-28 ENCOUNTER — Other Ambulatory Visit (HOSPITAL_COMMUNITY): Payer: Self-pay

## 2024-03-28 ENCOUNTER — Other Ambulatory Visit (HOSPITAL_BASED_OUTPATIENT_CLINIC_OR_DEPARTMENT_OTHER): Payer: Self-pay

## 2024-03-28 MED ORDER — ESTRADIOL 0.01 % VA CREA
2.0000 g | TOPICAL_CREAM | Freq: Every day | VAGINAL | 0 refills | Status: AC
  Filled 2024-03-28: qty 42.5, 30d supply, fill #0
  Filled 2024-04-08: qty 42.5, 21d supply, fill #0

## 2024-03-28 MED ORDER — PROGESTERONE MICRONIZED 100 MG PO CAPS
100.0000 mg | ORAL_CAPSULE | Freq: Every day | ORAL | 1 refills | Status: AC
  Filled 2024-03-28 – 2024-03-31 (×4): qty 90, 90d supply, fill #0

## 2024-03-28 MED ORDER — ESTRADIOL 0.5 MG PO TABS
0.5000 mg | ORAL_TABLET | Freq: Every day | ORAL | 1 refills | Status: AC
  Filled 2024-03-28: qty 90, 90d supply, fill #0

## 2024-03-28 MED ORDER — ESTRADIOL 0.5 MG PO TABS
0.5000 mg | ORAL_TABLET | Freq: Every day | ORAL | 0 refills | Status: AC
  Filled 2024-03-28 – 2024-04-08 (×2): qty 90, 90d supply, fill #0

## 2024-03-28 MED ORDER — PROGESTERONE MICRONIZED 100 MG PO CAPS
100.0000 mg | ORAL_CAPSULE | Freq: Every day | ORAL | 0 refills | Status: AC
  Filled 2024-03-28 – 2024-04-08 (×2): qty 90, 90d supply, fill #0

## 2024-03-29 ENCOUNTER — Other Ambulatory Visit (HOSPITAL_COMMUNITY): Payer: Self-pay

## 2024-03-30 ENCOUNTER — Other Ambulatory Visit (HOSPITAL_COMMUNITY): Payer: Self-pay

## 2024-03-31 ENCOUNTER — Other Ambulatory Visit: Payer: Self-pay

## 2024-03-31 ENCOUNTER — Other Ambulatory Visit (HOSPITAL_COMMUNITY): Payer: Self-pay

## 2024-03-31 ENCOUNTER — Other Ambulatory Visit (HOSPITAL_BASED_OUTPATIENT_CLINIC_OR_DEPARTMENT_OTHER): Payer: Self-pay

## 2024-03-31 MED ORDER — TRETINOIN 0.1 % EX CREA
1.0000 | TOPICAL_CREAM | Freq: Every day | CUTANEOUS | 2 refills | Status: AC
  Filled 2024-03-31 – 2024-04-11 (×2): qty 45, 30d supply, fill #0
  Filled 2024-05-04: qty 45, 30d supply, fill #1

## 2024-04-07 ENCOUNTER — Other Ambulatory Visit (HOSPITAL_BASED_OUTPATIENT_CLINIC_OR_DEPARTMENT_OTHER): Payer: Self-pay

## 2024-04-08 ENCOUNTER — Other Ambulatory Visit (HOSPITAL_COMMUNITY): Payer: Self-pay

## 2024-04-08 ENCOUNTER — Other Ambulatory Visit (HOSPITAL_BASED_OUTPATIENT_CLINIC_OR_DEPARTMENT_OTHER): Payer: Self-pay

## 2024-04-08 ENCOUNTER — Other Ambulatory Visit: Payer: Self-pay

## 2024-04-09 ENCOUNTER — Other Ambulatory Visit: Payer: Self-pay

## 2024-04-10 ENCOUNTER — Other Ambulatory Visit (HOSPITAL_BASED_OUTPATIENT_CLINIC_OR_DEPARTMENT_OTHER): Payer: Self-pay

## 2024-04-11 ENCOUNTER — Other Ambulatory Visit (HOSPITAL_COMMUNITY): Payer: Self-pay

## 2024-05-19 ENCOUNTER — Other Ambulatory Visit (HOSPITAL_COMMUNITY): Payer: Self-pay

## 2024-05-20 ENCOUNTER — Other Ambulatory Visit (HOSPITAL_BASED_OUTPATIENT_CLINIC_OR_DEPARTMENT_OTHER): Payer: Self-pay

## 2024-05-20 MED ORDER — AMPHETAMINE-DEXTROAMPHETAMINE 20 MG PO TABS
20.0000 mg | ORAL_TABLET | Freq: Two times a day (BID) | ORAL | 0 refills | Status: AC
  Filled 2024-05-20: qty 60, 30d supply, fill #0
